# Patient Record
Sex: Female | Born: 1970
Health system: Southern US, Community
[De-identification: ages and names within clinical notes are randomized; demographics above are authoritative.]

## PROBLEM LIST (undated history)

## (undated) DIAGNOSIS — K219 Gastro-esophageal reflux disease without esophagitis: Secondary | ICD-10-CM

## (undated) DIAGNOSIS — I10 Essential (primary) hypertension: Secondary | ICD-10-CM

## (undated) HISTORY — PX: TUBAL LIGATION: SHX77

## (undated) HISTORY — DX: Essential (primary) hypertension: I10

## (undated) HISTORY — PX: WISDOM TOOTH EXTRACTION: SHX21

## (undated) HISTORY — PX: BREAST LUMPECTOMY: SHX2

---

## 1998-03-18 ENCOUNTER — Ambulatory Visit (HOSPITAL_COMMUNITY): Admission: RE | Admit: 1998-03-18 | Discharge: 1998-03-18 | Payer: Self-pay | Admitting: Obstetrics and Gynecology

## 1998-04-10 ENCOUNTER — Observation Stay (HOSPITAL_COMMUNITY): Admission: AD | Admit: 1998-04-10 | Discharge: 1998-04-11 | Payer: Self-pay | Admitting: Obstetrics and Gynecology

## 1998-04-12 ENCOUNTER — Inpatient Hospital Stay (HOSPITAL_COMMUNITY): Admission: AD | Admit: 1998-04-12 | Discharge: 1998-04-15 | Payer: Self-pay | Admitting: Obstetrics and Gynecology

## 1998-06-04 ENCOUNTER — Ambulatory Visit (HOSPITAL_COMMUNITY): Admission: RE | Admit: 1998-06-04 | Discharge: 1998-06-04 | Payer: Self-pay | Admitting: Obstetrics and Gynecology

## 2003-01-30 ENCOUNTER — Other Ambulatory Visit: Admission: RE | Admit: 2003-01-30 | Discharge: 2003-01-30 | Payer: Self-pay | Admitting: Obstetrics and Gynecology

## 2004-12-12 ENCOUNTER — Other Ambulatory Visit: Admission: RE | Admit: 2004-12-12 | Discharge: 2004-12-12 | Payer: Self-pay | Admitting: Obstetrics and Gynecology

## 2004-12-26 ENCOUNTER — Encounter: Admission: RE | Admit: 2004-12-26 | Discharge: 2004-12-26 | Payer: Self-pay | Admitting: Obstetrics and Gynecology

## 2005-01-06 ENCOUNTER — Encounter: Admission: RE | Admit: 2005-01-06 | Discharge: 2005-01-06 | Payer: Self-pay | Admitting: Obstetrics and Gynecology

## 2005-06-11 ENCOUNTER — Encounter: Admission: RE | Admit: 2005-06-11 | Discharge: 2005-06-11 | Payer: Self-pay | Admitting: Obstetrics and Gynecology

## 2005-12-09 ENCOUNTER — Encounter: Admission: RE | Admit: 2005-12-09 | Discharge: 2005-12-09 | Payer: Self-pay | Admitting: Obstetrics and Gynecology

## 2010-01-14 ENCOUNTER — Other Ambulatory Visit: Admission: RE | Admit: 2010-01-14 | Discharge: 2010-01-14 | Payer: Self-pay | Admitting: Obstetrics and Gynecology

## 2010-01-23 ENCOUNTER — Encounter: Admission: RE | Admit: 2010-01-23 | Discharge: 2010-01-23 | Payer: Self-pay | Admitting: Obstetrics and Gynecology

## 2010-04-26 ENCOUNTER — Encounter: Payer: Self-pay | Admitting: Obstetrics and Gynecology

## 2010-12-29 ENCOUNTER — Other Ambulatory Visit: Payer: Self-pay | Admitting: Obstetrics and Gynecology

## 2010-12-29 DIAGNOSIS — Z1231 Encounter for screening mammogram for malignant neoplasm of breast: Secondary | ICD-10-CM

## 2011-01-19 ENCOUNTER — Other Ambulatory Visit: Payer: Self-pay | Admitting: Obstetrics and Gynecology

## 2011-01-19 ENCOUNTER — Other Ambulatory Visit (HOSPITAL_COMMUNITY)
Admission: RE | Admit: 2011-01-19 | Discharge: 2011-01-19 | Disposition: A | Discharge status: Home / Self Care | Payer: Managed Care, Other (non HMO) | Source: Ambulatory Visit | Attending: Obstetrics and Gynecology | Admitting: Obstetrics and Gynecology

## 2011-01-19 DIAGNOSIS — Z01419 Encounter for gynecological examination (general) (routine) without abnormal findings: Secondary | ICD-10-CM | POA: Insufficient documentation

## 2011-01-27 ENCOUNTER — Ambulatory Visit
Admission: RE | Admit: 2011-01-27 | Discharge: 2011-01-27 | Disposition: A | Discharge status: Home / Self Care | Payer: Managed Care, Other (non HMO) | Source: Ambulatory Visit | Attending: Obstetrics and Gynecology | Admitting: Obstetrics and Gynecology

## 2011-01-27 DIAGNOSIS — Z1231 Encounter for screening mammogram for malignant neoplasm of breast: Secondary | ICD-10-CM

## 2011-12-22 ENCOUNTER — Other Ambulatory Visit: Payer: Self-pay | Admitting: *Deleted

## 2011-12-22 DIAGNOSIS — Z1231 Encounter for screening mammogram for malignant neoplasm of breast: Secondary | ICD-10-CM

## 2012-01-20 ENCOUNTER — Other Ambulatory Visit (HOSPITAL_COMMUNITY)
Admission: RE | Admit: 2012-01-20 | Discharge: 2012-01-20 | Disposition: A | Discharge status: Home / Self Care | Payer: Managed Care, Other (non HMO) | Source: Ambulatory Visit | Attending: Obstetrics and Gynecology | Admitting: Obstetrics and Gynecology

## 2012-01-20 ENCOUNTER — Other Ambulatory Visit: Payer: Self-pay | Admitting: Obstetrics and Gynecology

## 2012-01-20 DIAGNOSIS — Z1151 Encounter for screening for human papillomavirus (HPV): Secondary | ICD-10-CM | POA: Insufficient documentation

## 2012-01-20 DIAGNOSIS — Z01419 Encounter for gynecological examination (general) (routine) without abnormal findings: Secondary | ICD-10-CM | POA: Insufficient documentation

## 2012-01-28 ENCOUNTER — Other Ambulatory Visit: Payer: Self-pay | Admitting: Obstetrics and Gynecology

## 2012-01-28 ENCOUNTER — Ambulatory Visit
Admission: RE | Admit: 2012-01-28 | Discharge: 2012-01-28 | Disposition: A | Discharge status: Home / Self Care | Payer: Managed Care, Other (non HMO) | Source: Ambulatory Visit | Attending: *Deleted | Admitting: *Deleted

## 2012-01-28 DIAGNOSIS — Z1231 Encounter for screening mammogram for malignant neoplasm of breast: Secondary | ICD-10-CM

## 2013-01-09 ENCOUNTER — Other Ambulatory Visit: Payer: Self-pay

## 2013-01-09 DIAGNOSIS — Z1231 Encounter for screening mammogram for malignant neoplasm of breast: Secondary | ICD-10-CM

## 2013-02-02 ENCOUNTER — Ambulatory Visit
Admission: RE | Admit: 2013-02-02 | Discharge: 2013-02-02 | Disposition: A | Discharge status: Home / Self Care | Payer: Managed Care, Other (non HMO) | Source: Ambulatory Visit

## 2013-02-02 DIAGNOSIS — Z1231 Encounter for screening mammogram for malignant neoplasm of breast: Secondary | ICD-10-CM

## 2014-01-23 ENCOUNTER — Other Ambulatory Visit: Payer: Self-pay | Admitting: Obstetrics and Gynecology

## 2014-01-23 ENCOUNTER — Other Ambulatory Visit (HOSPITAL_COMMUNITY)
Admission: RE | Admit: 2014-01-23 | Discharge: 2014-01-23 | Disposition: A | Discharge status: Home / Self Care | Payer: Managed Care, Other (non HMO) | Source: Ambulatory Visit | Attending: Obstetrics and Gynecology | Admitting: Obstetrics and Gynecology

## 2014-01-23 DIAGNOSIS — Z01419 Encounter for gynecological examination (general) (routine) without abnormal findings: Secondary | ICD-10-CM | POA: Insufficient documentation

## 2014-01-25 LAB — CYTOLOGY - PAP

## 2014-02-20 ENCOUNTER — Other Ambulatory Visit: Payer: Self-pay

## 2014-02-20 DIAGNOSIS — Z1231 Encounter for screening mammogram for malignant neoplasm of breast: Secondary | ICD-10-CM

## 2014-03-19 ENCOUNTER — Ambulatory Visit
Admission: RE | Admit: 2014-03-19 | Discharge: 2014-03-19 | Disposition: A | Discharge status: Home / Self Care | Payer: Managed Care, Other (non HMO) | Source: Ambulatory Visit

## 2014-03-19 DIAGNOSIS — Z1231 Encounter for screening mammogram for malignant neoplasm of breast: Secondary | ICD-10-CM

## 2015-02-13 ENCOUNTER — Other Ambulatory Visit (HOSPITAL_COMMUNITY)
Admission: RE | Admit: 2015-02-13 | Discharge: 2015-02-13 | Disposition: A | Discharge status: Home / Self Care | Payer: Managed Care, Other (non HMO) | Source: Ambulatory Visit | Attending: Obstetrics and Gynecology | Admitting: Obstetrics and Gynecology

## 2015-02-13 ENCOUNTER — Other Ambulatory Visit: Payer: Self-pay

## 2015-02-13 ENCOUNTER — Other Ambulatory Visit: Payer: Self-pay | Admitting: Obstetrics and Gynecology

## 2015-02-13 DIAGNOSIS — Z01419 Encounter for gynecological examination (general) (routine) without abnormal findings: Secondary | ICD-10-CM | POA: Diagnosis present

## 2015-02-13 DIAGNOSIS — Z1231 Encounter for screening mammogram for malignant neoplasm of breast: Secondary | ICD-10-CM

## 2015-02-13 DIAGNOSIS — Z1151 Encounter for screening for human papillomavirus (HPV): Secondary | ICD-10-CM | POA: Diagnosis present

## 2015-02-14 LAB — CYTOLOGY - PAP

## 2015-03-21 ENCOUNTER — Ambulatory Visit
Admission: RE | Admit: 2015-03-21 | Discharge: 2015-03-21 | Disposition: A | Discharge status: Home / Self Care | Payer: Managed Care, Other (non HMO) | Source: Ambulatory Visit

## 2015-03-21 DIAGNOSIS — Z1231 Encounter for screening mammogram for malignant neoplasm of breast: Secondary | ICD-10-CM

## 2015-07-22 DIAGNOSIS — Z0000 Encounter for general adult medical examination without abnormal findings: Secondary | ICD-10-CM | POA: Diagnosis not present

## 2015-10-04 DIAGNOSIS — L700 Acne vulgaris: Secondary | ICD-10-CM | POA: Diagnosis not present

## 2016-01-30 DIAGNOSIS — R55 Syncope and collapse: Secondary | ICD-10-CM | POA: Diagnosis not present

## 2016-02-18 ENCOUNTER — Other Ambulatory Visit: Payer: Self-pay | Admitting: Obstetrics and Gynecology

## 2016-02-18 DIAGNOSIS — Z1231 Encounter for screening mammogram for malignant neoplasm of breast: Secondary | ICD-10-CM

## 2016-02-25 DIAGNOSIS — Z01411 Encounter for gynecological examination (general) (routine) with abnormal findings: Secondary | ICD-10-CM | POA: Diagnosis not present

## 2016-03-23 ENCOUNTER — Ambulatory Visit
Admission: RE | Admit: 2016-03-23 | Discharge: 2016-03-23 | Disposition: A | Discharge status: Home / Self Care | Payer: BLUE CROSS/BLUE SHIELD | Source: Ambulatory Visit | Attending: Obstetrics and Gynecology | Admitting: Obstetrics and Gynecology

## 2016-03-23 DIAGNOSIS — Z1231 Encounter for screening mammogram for malignant neoplasm of breast: Secondary | ICD-10-CM | POA: Diagnosis not present

## 2016-07-22 DIAGNOSIS — Z0000 Encounter for general adult medical examination without abnormal findings: Secondary | ICD-10-CM | POA: Diagnosis not present

## 2016-07-24 DIAGNOSIS — M65331 Trigger finger, right middle finger: Secondary | ICD-10-CM | POA: Diagnosis not present

## 2016-09-02 DIAGNOSIS — M65331 Trigger finger, right middle finger: Secondary | ICD-10-CM | POA: Diagnosis not present

## 2017-02-03 ENCOUNTER — Other Ambulatory Visit: Payer: Self-pay | Admitting: Obstetrics and Gynecology

## 2017-02-03 DIAGNOSIS — Z139 Encounter for screening, unspecified: Secondary | ICD-10-CM

## 2017-03-01 DIAGNOSIS — Z01411 Encounter for gynecological examination (general) (routine) with abnormal findings: Secondary | ICD-10-CM | POA: Diagnosis not present

## 2017-03-01 DIAGNOSIS — N920 Excessive and frequent menstruation with regular cycle: Secondary | ICD-10-CM | POA: Diagnosis not present

## 2017-03-01 DIAGNOSIS — L292 Pruritus vulvae: Secondary | ICD-10-CM | POA: Diagnosis not present

## 2017-03-24 ENCOUNTER — Ambulatory Visit
Admission: RE | Admit: 2017-03-24 | Discharge: 2017-03-24 | Disposition: A | Discharge status: Home / Self Care | Payer: BLUE CROSS/BLUE SHIELD | Source: Ambulatory Visit | Attending: Obstetrics and Gynecology | Admitting: Obstetrics and Gynecology

## 2017-03-24 DIAGNOSIS — Z1231 Encounter for screening mammogram for malignant neoplasm of breast: Secondary | ICD-10-CM | POA: Diagnosis not present

## 2017-03-24 DIAGNOSIS — Z139 Encounter for screening, unspecified: Secondary | ICD-10-CM

## 2017-06-16 DIAGNOSIS — N920 Excessive and frequent menstruation with regular cycle: Secondary | ICD-10-CM | POA: Diagnosis not present

## 2017-06-22 DIAGNOSIS — K219 Gastro-esophageal reflux disease without esophagitis: Secondary | ICD-10-CM | POA: Diagnosis not present

## 2017-06-22 DIAGNOSIS — J309 Allergic rhinitis, unspecified: Secondary | ICD-10-CM | POA: Diagnosis not present

## 2017-07-27 DIAGNOSIS — Z0000 Encounter for general adult medical examination without abnormal findings: Secondary | ICD-10-CM | POA: Diagnosis not present

## 2017-07-27 DIAGNOSIS — Z13220 Encounter for screening for lipoid disorders: Secondary | ICD-10-CM | POA: Diagnosis not present

## 2017-09-23 DIAGNOSIS — N920 Excessive and frequent menstruation with regular cycle: Secondary | ICD-10-CM | POA: Diagnosis not present

## 2017-11-18 DIAGNOSIS — L309 Dermatitis, unspecified: Secondary | ICD-10-CM | POA: Diagnosis not present

## 2017-12-07 DIAGNOSIS — L700 Acne vulgaris: Secondary | ICD-10-CM | POA: Diagnosis not present

## 2018-03-07 ENCOUNTER — Other Ambulatory Visit (HOSPITAL_COMMUNITY)
Admission: RE | Admit: 2018-03-07 | Discharge: 2018-03-07 | Disposition: A | Discharge status: Home / Self Care | Payer: BLUE CROSS/BLUE SHIELD | Source: Ambulatory Visit | Attending: Obstetrics and Gynecology | Admitting: Obstetrics and Gynecology

## 2018-03-07 ENCOUNTER — Other Ambulatory Visit: Payer: Self-pay | Admitting: Obstetrics and Gynecology

## 2018-03-07 DIAGNOSIS — N921 Excessive and frequent menstruation with irregular cycle: Secondary | ICD-10-CM | POA: Diagnosis not present

## 2018-03-07 DIAGNOSIS — Z124 Encounter for screening for malignant neoplasm of cervix: Secondary | ICD-10-CM | POA: Diagnosis not present

## 2018-03-07 DIAGNOSIS — Z01419 Encounter for gynecological examination (general) (routine) without abnormal findings: Secondary | ICD-10-CM | POA: Diagnosis not present

## 2018-03-08 LAB — CYTOLOGY - PAP
Diagnosis: NEGATIVE
HPV: NOT DETECTED

## 2018-03-10 ENCOUNTER — Other Ambulatory Visit: Payer: Self-pay | Admitting: Obstetrics and Gynecology

## 2018-03-10 DIAGNOSIS — Z1231 Encounter for screening mammogram for malignant neoplasm of breast: Secondary | ICD-10-CM

## 2018-03-25 ENCOUNTER — Ambulatory Visit: Payer: BLUE CROSS/BLUE SHIELD

## 2018-04-21 DIAGNOSIS — N921 Excessive and frequent menstruation with irregular cycle: Secondary | ICD-10-CM | POA: Diagnosis not present

## 2018-05-02 ENCOUNTER — Ambulatory Visit
Admission: RE | Admit: 2018-05-02 | Discharge: 2018-05-02 | Disposition: A | Discharge status: Home / Self Care | Payer: BLUE CROSS/BLUE SHIELD | Source: Ambulatory Visit | Attending: Obstetrics and Gynecology | Admitting: Obstetrics and Gynecology

## 2018-05-02 DIAGNOSIS — Z1231 Encounter for screening mammogram for malignant neoplasm of breast: Secondary | ICD-10-CM

## 2018-05-04 ENCOUNTER — Other Ambulatory Visit: Payer: Self-pay | Admitting: Obstetrics and Gynecology

## 2018-05-04 DIAGNOSIS — R928 Other abnormal and inconclusive findings on diagnostic imaging of breast: Secondary | ICD-10-CM

## 2018-05-06 ENCOUNTER — Ambulatory Visit
Admission: RE | Admit: 2018-05-06 | Discharge: 2018-05-06 | Disposition: A | Discharge status: Home / Self Care | Payer: BLUE CROSS/BLUE SHIELD | Source: Ambulatory Visit | Attending: Obstetrics and Gynecology | Admitting: Obstetrics and Gynecology

## 2018-05-06 ENCOUNTER — Other Ambulatory Visit: Payer: Self-pay | Admitting: Obstetrics and Gynecology

## 2018-05-06 DIAGNOSIS — R921 Mammographic calcification found on diagnostic imaging of breast: Secondary | ICD-10-CM | POA: Diagnosis not present

## 2018-05-06 DIAGNOSIS — R928 Other abnormal and inconclusive findings on diagnostic imaging of breast: Secondary | ICD-10-CM

## 2018-05-09 DIAGNOSIS — Z3202 Encounter for pregnancy test, result negative: Secondary | ICD-10-CM | POA: Diagnosis not present

## 2018-05-09 DIAGNOSIS — Z30430 Encounter for insertion of intrauterine contraceptive device: Secondary | ICD-10-CM | POA: Diagnosis not present

## 2018-05-10 ENCOUNTER — Other Ambulatory Visit: Payer: Self-pay | Admitting: Obstetrics and Gynecology

## 2018-05-10 ENCOUNTER — Ambulatory Visit
Admission: RE | Admit: 2018-05-10 | Discharge: 2018-05-10 | Disposition: A | Discharge status: Home / Self Care | Payer: BLUE CROSS/BLUE SHIELD | Source: Ambulatory Visit | Attending: Obstetrics and Gynecology | Admitting: Obstetrics and Gynecology

## 2018-05-10 DIAGNOSIS — R921 Mammographic calcification found on diagnostic imaging of breast: Secondary | ICD-10-CM

## 2018-05-10 DIAGNOSIS — R928 Other abnormal and inconclusive findings on diagnostic imaging of breast: Secondary | ICD-10-CM

## 2018-05-10 DIAGNOSIS — N6012 Diffuse cystic mastopathy of left breast: Secondary | ICD-10-CM | POA: Diagnosis not present

## 2018-05-10 HISTORY — PX: BREAST BIOPSY: SHX20

## 2018-06-13 ENCOUNTER — Other Ambulatory Visit: Payer: Self-pay | Admitting: General Surgery

## 2018-06-13 DIAGNOSIS — N6092 Unspecified benign mammary dysplasia of left breast: Secondary | ICD-10-CM

## 2018-06-14 ENCOUNTER — Other Ambulatory Visit: Payer: Self-pay | Admitting: General Surgery

## 2018-06-14 DIAGNOSIS — N6092 Unspecified benign mammary dysplasia of left breast: Secondary | ICD-10-CM

## 2018-06-20 DIAGNOSIS — Z30431 Encounter for routine checking of intrauterine contraceptive device: Secondary | ICD-10-CM | POA: Diagnosis not present

## 2018-07-13 DIAGNOSIS — M79604 Pain in right leg: Secondary | ICD-10-CM | POA: Diagnosis not present

## 2018-07-14 ENCOUNTER — Other Ambulatory Visit: Payer: Self-pay

## 2018-07-14 ENCOUNTER — Encounter: Payer: Self-pay | Admitting: Emergency Medicine

## 2018-07-14 ENCOUNTER — Emergency Department
Admission: EM | Admit: 2018-07-14 | Discharge: 2018-07-14 | Disposition: A | Discharge status: Home / Self Care | Payer: BLUE CROSS/BLUE SHIELD | Attending: Emergency Medicine | Admitting: Emergency Medicine

## 2018-07-14 ENCOUNTER — Emergency Department: Payer: BLUE CROSS/BLUE SHIELD

## 2018-07-14 DIAGNOSIS — M79661 Pain in right lower leg: Secondary | ICD-10-CM | POA: Diagnosis not present

## 2018-07-14 DIAGNOSIS — M79604 Pain in right leg: Secondary | ICD-10-CM | POA: Diagnosis not present

## 2018-07-14 DIAGNOSIS — Z79899 Other long term (current) drug therapy: Secondary | ICD-10-CM | POA: Diagnosis not present

## 2018-07-14 LAB — COMPREHENSIVE METABOLIC PANEL
ALT: 22 U/L (ref 0–44)
AST: 21 U/L (ref 15–41)
Albumin: 4 g/dL (ref 3.5–5.0)
Alkaline Phosphatase: 68 U/L (ref 38–126)
Anion gap: 10 (ref 5–15)
BUN: 9 mg/dL (ref 6–20)
CO2: 26 mmol/L (ref 22–32)
Calcium: 9.1 mg/dL (ref 8.9–10.3)
Chloride: 104 mmol/L (ref 98–111)
Creatinine, Ser: 0.8 mg/dL (ref 0.44–1.00)
GFR calc Af Amer: >60 mL/min (ref >60)
GFR calc non Af Amer: >60 mL/min (ref >60)
Glucose, Bld: 121 mg/dL — ABNORMAL HIGH (ref 70–99)
Potassium: 3.3 mmol/L — ABNORMAL LOW (ref 3.5–5.1)
Sodium: 140 mmol/L (ref 135–145)
Total Bilirubin: 1 mg/dL (ref 0.3–1.2)
Total Protein: 7.1 g/dL (ref 6.5–8.1)

## 2018-07-14 LAB — CBC
HCT: 37.2 % (ref 36.0–46.0)
Hemoglobin: 12.2 g/dL (ref 12.0–15.0)
MCH: 34.1 pg — ABNORMAL HIGH (ref 26.0–34.0)
MCHC: 32.8 g/dL (ref 30.0–36.0)
MCV: 103.9 fL — ABNORMAL HIGH (ref 80.0–100.0)
Platelets: 248 10*3/uL (ref 150–400)
RBC: 3.58 MIL/uL — ABNORMAL LOW (ref 3.87–5.11)
RDW: 11.6 % (ref 11.5–15.5)
WBC: 10 10*3/uL (ref 4.0–10.5)
nRBC: 0 % (ref 0.0–0.2)

## 2018-07-14 MED ORDER — POTASSIUM CHLORIDE CRYS ER 20 MEQ PO TBCR
40.0000 meq | EXTENDED_RELEASE_TABLET | Freq: Once | ORAL | Status: AC
  Administered 2018-07-14: 40 meq via ORAL
  Filled 2018-07-14: qty 2

## 2018-07-14 MED ORDER — OXYCODONE-ACETAMINOPHEN 5-325 MG PO TABS: 1.0000 | ORAL_TABLET | Freq: Four times a day (QID) | ORAL | 0 refills | Status: AC | PRN

## 2018-07-14 MED ORDER — HYDROCODONE-ACETAMINOPHEN 5-325 MG PO TABS
1.0000 | ORAL_TABLET | Freq: Once | ORAL | Status: AC
  Administered 2018-07-14: 1 via ORAL
  Filled 2018-07-14: qty 1

## 2018-07-14 MED ORDER — KETOROLAC TROMETHAMINE 30 MG/ML IJ SOLN
30.0000 mg | Freq: Once | INTRAMUSCULAR | Status: AC
  Administered 2018-07-14: 30 mg via INTRAVENOUS
  Filled 2018-07-14: qty 1

## 2018-07-14 NOTE — ED Notes (Signed)
Pt c /o right leg pain not relieved by Toradol, Dr. Scotty Court notified and vicodin ordered

## 2018-07-14 NOTE — ED Notes (Signed)
Late note While getting patient to the wheelchair for discharge she c/o severe pain in right leg, notified Dr. Scotty Court who went to the bedside and cleared patient for discharge

## 2018-07-14 NOTE — ED Provider Notes (Addendum)
-----------------------------------------   8:29 AM on 07/14/2018 -----------------------------------------   Assumed care from Dr. Manson Passey pending ultrasound to rule out DVT.  The ultrasound is negative.  Patient has been complaining of persistent pain so she was also given a Vicodin.  However she is still pretty uncomfortable.  I discussed the results with her and reassessed, and the leg overall appears normal on exam.  Skin is normal, no varicosities.  Good distal pulses, toes are warm.  No tenderness, Achilles function intact.  Knee is stable.  No trauma history, did not step in a hole or potentially sustain a popliteal injury.  No prior wounds or infections, no current inflammatory changes.  No crepitus.  Given the significance of the pain and the very benign history and exam, high suspicion is a ruptured Baker's cyst which will be self-limited.  No further imaging or testing necessary at this time, angiography would not be fruitful.  Plan to treat symptomatically, continue diclofenac that she has from her doctor, add on Tylenol.  I will add on 2 days of oxycodone as well.  Heat therapy.  Stable for outpatient follow-up.  Controlled substance reporting system seems to be offline right now, unable to access.  Patient reports last opioid use was codeine after a root canal 10 years ago which made her nauseated.  She seems reliable, low risk for abuse or diversion.  Final diagnoses:  Right leg pain      Sharman Cheek, MD 07/14/18 [phone]   Patient had apparent panic attack while being moved to wheelchair for discharge.  Was hyperventilating, feeling warm with bilateral finger tingling.  Encouraged her to slow her breathing if possible.  Reassess the leg which again is soft and nontender.  There is no clear explanation of her symptoms still.  When allowed to rest in the wheelchair with no staff in the room, the patient was observed from the doorway to be completely calm   Sharman Cheek,  MD 07/14/18 [phone]

## 2018-07-14 NOTE — ED Notes (Signed)
Pt stated that she took tylenol around midnight and took a diclofenac around 4am without any relief. Pt stated that she prescribed the diclofenac yesterday via PCP. Denies any injury to site.

## 2018-07-14 NOTE — ED Provider Notes (Signed)
[facility] Emergency Department Provider Note    First MD Initiated Contact with Patient 07/14/18 [phone]     (approximate)  I have reviewed the triage vital signs and the nursing notes.   HISTORY  Chief Complaint Leg Pain    HPI Amber Cooper is a 48 y.o. female with medical history as listed below   presents to the emergency department with 1-day history of nontraumatic right calf lower extremity (calf) pain which patient states is currently 10 out of 10.  Patient states the pain increased tonight which prompted her visit to the emergency department.  Patient states that she has been taking diclofenac without any relief.  Patient denies any lower extremity swelling.  Patient denies any dyspnea no chest pain.  Patient denies any personal familial history of DVT or PE.        History reviewed. No pertinent past medical history.  There are no active problems to display for this patient.   History reviewed. No pertinent surgical history.  Prior to Admission medications   Medication Sig Start Date End Date Taking? Authorizing Provider  albuterol (PROAIR HFA) 108 (90 Base) MCG/ACT inhaler Take 1-2 puffs by mouth as needed for wheezing. 07/22/16  Yes [provider]  diclofenac (VOLTAREN) 50 MG EC tablet Take 50 mg by mouth 2 (two) times daily. 07/13/18  Yes [provider]  Multiple Vitamin (MULTIVITAMIN) capsule Take 1 capsule by mouth daily.   Yes [provider]  Doxycycline Hyclate 150 MG TABS Take 1 tablet by mouth daily. 02/28/18   [provider]  omeprazole (PRILOSEC) 20 MG capsule Take 20 mg by mouth daily. 04/14/18   [provider]    Allergies Patient has no known allergies.  No family history on file.  Social History Social History   Tobacco Use  . Smoking status: Never Smoker  . Smokeless tobacco: Never Used  Substance Use Topics  . Alcohol use: Not on file  . Drug use: Not on file     Review of Systems Constitutional: No fever/chills Eyes: No visual changes. ENT: No sore throat. Cardiovascular: Denies chest pain. Respiratory: Denies shortness of breath. Gastrointestinal: No abdominal pain.  No nausea, no vomiting.  No diarrhea.  No constipation. Genitourinary: Negative for dysuria. Musculoskeletal: Negative for neck pain.  Negative for back pain.  Positive for right lower extremity pain. Integumentary: Negative for rash. Neurological: Negative for headaches, focal weakness or numbness.   ____________________________________________   PHYSICAL EXAM:  VITAL SIGNS: ED Triage Vitals  Enc Vitals Group     BP 07/14/18 0531 (!) 143/89     Pulse Rate 07/14/18 0531 86     Resp 07/14/18 0531 20     Temp 07/14/18 0531 98.6 F (37 C)     Temp Source 07/14/18 0531 Oral     SpO2 07/14/18 0531 99 %     Weight 07/14/18 0526 74.8 kg (165 lb)     Height 07/14/18 0526 1.626 m (5\' 4" )     Head Circumference --      Peak Flow --      Pain Score 07/14/18 0526 10     Pain Loc --      Pain Edu? --      Excl. in GC? --     Constitutional: Alert and oriented.  Apparent discomfort  eyes: Conjunctivae are normal.  Mouth/Throat: Mucous membranes are moist.  Oropharynx non-erythematous. Neck: No stridor.   Cardiovascular: Normal rate, regular rhythm. Good peripheral  circulation. Grossly normal heart sounds. Respiratory: Normal respiratory effort.  No retractions. Lungs CTAB. Gastrointestinal: Soft and nontender. No distention.  Musculoskeletal: No lower extremity edema appreciated.  Positive Homans sign.  Palpable bilateral PT DP pulses which are equal Neurologic:  Normal speech and language. No gross focal neurologic deficits are appreciated.  Skin:  Skin is warm, dry and intact. No rash noted. Psychiatric: Mood and affect are normal. Speech and behavior are normal.    Procedures   ____________________________________________   INITIAL IMPRESSION / MDM / ASSESSMENT  AND PLAN / ED COURSE  As part of my medical decision making, I reviewed the following data within the electronic MEDICAL RECORD NUMBER   48 year old female with above-stated history physical exam secondary to nontraumatic right leg pain.  Considered possibility of DVT and as such ultrasound performed.  Also considered possibility of arterial insufficiency however patient has equal bilateral PT DP pulses.  Patient's care transferred to Dr. Derry SkillStafford  Emilea Melvern BankerP Schey was evaluated in Emergency Department on 07/14/2018 for the symptoms described in the history of present illness. She was evaluated in the context of the global COVID-19 pandemic, which necessitated consideration that the patient might be at risk for infection with the SARS-CoV-2 virus that causes COVID-19. Institutional protocols and algorithms that pertain to the evaluation of patients at risk for COVID-19 are in a state of rapid change based on information released by regulatory bodies including the CDC and federal and state organizations. These policies and algorithms were followed during the patient's care in the ED.      ____________________________________________  FINAL CLINICAL IMPRESSION(S) / ED DIAGNOSES  Final diagnoses:  Right leg pain     MEDICATIONS GIVEN DURING THIS VISIT:  Medications  potassium chloride SA (K-DUR,KLOR-CON) CR tablet 40 mEq (has no administration in time range)  ketorolac (TORADOL) 30 MG/ML injection 30 mg (30 mg Intravenous Given 07/14/18 [id number])     ED Discharge Orders    None       Note:  This document was prepared using Dragon voice recognition software and may include unintentional dictation errors.   Darci CurrentBrown, Blades N, MD 07/14/18 [phone]

## 2018-07-14 NOTE — ED Triage Notes (Addendum)
Pt to triage via w/c, tearful, anxious & hyperventilating; c/o right lower leg pain since yesterday with no known injury; st pain increased tonight; rx diclofenac without relief; denies swelling, denies any accomp symptoms

## 2018-07-22 ENCOUNTER — Ambulatory Visit
Admission: RE | Admit: 2018-07-22 | Discharge: 2018-07-22 | Disposition: A | Discharge status: Home / Self Care | Payer: BLUE CROSS/BLUE SHIELD | Source: Ambulatory Visit | Attending: Physician Assistant | Admitting: Physician Assistant

## 2018-07-22 ENCOUNTER — Other Ambulatory Visit: Payer: Self-pay | Admitting: Physician Assistant

## 2018-07-22 ENCOUNTER — Other Ambulatory Visit: Payer: Self-pay

## 2018-07-22 DIAGNOSIS — M79671 Pain in right foot: Secondary | ICD-10-CM | POA: Diagnosis not present

## 2018-07-26 DIAGNOSIS — M545 Low back pain: Secondary | ICD-10-CM | POA: Diagnosis not present

## 2018-07-28 DIAGNOSIS — M545 Low back pain: Secondary | ICD-10-CM | POA: Diagnosis not present

## 2018-08-02 DIAGNOSIS — M5126 Other intervertebral disc displacement, lumbar region: Secondary | ICD-10-CM | POA: Diagnosis not present

## 2018-08-02 DIAGNOSIS — M545 Low back pain: Secondary | ICD-10-CM | POA: Diagnosis not present

## 2018-08-02 DIAGNOSIS — M5416 Radiculopathy, lumbar region: Secondary | ICD-10-CM | POA: Diagnosis not present

## 2018-08-03 DIAGNOSIS — M545 Low back pain: Secondary | ICD-10-CM | POA: Diagnosis not present

## 2018-08-03 DIAGNOSIS — M5416 Radiculopathy, lumbar region: Secondary | ICD-10-CM | POA: Diagnosis not present

## 2018-08-12 ENCOUNTER — Other Ambulatory Visit: Payer: Self-pay | Admitting: General Surgery

## 2018-08-17 ENCOUNTER — Encounter (HOSPITAL_BASED_OUTPATIENT_CLINIC_OR_DEPARTMENT_OTHER): Payer: Self-pay

## 2018-08-17 ENCOUNTER — Ambulatory Visit (HOSPITAL_BASED_OUTPATIENT_CLINIC_OR_DEPARTMENT_OTHER): Admit: 2018-08-17 | Payer: BLUE CROSS/BLUE SHIELD | Admitting: General Surgery

## 2018-08-17 SURGERY — RADIOACTIVE SEED GUIDED BREAST BIOPSY
Anesthesia: General | Site: Breast | Laterality: Left

## 2018-08-23 DIAGNOSIS — M545 Low back pain: Secondary | ICD-10-CM | POA: Diagnosis not present

## 2018-08-23 DIAGNOSIS — M5126 Other intervertebral disc displacement, lumbar region: Secondary | ICD-10-CM | POA: Diagnosis not present

## 2018-08-23 DIAGNOSIS — M5416 Radiculopathy, lumbar region: Secondary | ICD-10-CM | POA: Diagnosis not present

## 2018-08-30 DIAGNOSIS — M9953 Intervertebral disc stenosis of neural canal of lumbar region: Secondary | ICD-10-CM | POA: Diagnosis not present

## 2018-08-30 DIAGNOSIS — M5116 Intervertebral disc disorders with radiculopathy, lumbar region: Secondary | ICD-10-CM | POA: Diagnosis not present

## 2018-08-30 DIAGNOSIS — S330XXD Traumatic rupture of lumbar intervertebral disc, subsequent encounter: Secondary | ICD-10-CM | POA: Diagnosis not present

## 2018-09-06 DIAGNOSIS — M5116 Intervertebral disc disorders with radiculopathy, lumbar region: Secondary | ICD-10-CM | POA: Diagnosis not present

## 2018-09-06 DIAGNOSIS — S330XXD Traumatic rupture of lumbar intervertebral disc, subsequent encounter: Secondary | ICD-10-CM | POA: Diagnosis not present

## 2018-09-06 DIAGNOSIS — M9953 Intervertebral disc stenosis of neural canal of lumbar region: Secondary | ICD-10-CM | POA: Diagnosis not present

## 2018-09-13 DIAGNOSIS — M5116 Intervertebral disc disorders with radiculopathy, lumbar region: Secondary | ICD-10-CM | POA: Diagnosis not present

## 2018-09-13 DIAGNOSIS — M9953 Intervertebral disc stenosis of neural canal of lumbar region: Secondary | ICD-10-CM | POA: Diagnosis not present

## 2018-09-13 DIAGNOSIS — S330XXD Traumatic rupture of lumbar intervertebral disc, subsequent encounter: Secondary | ICD-10-CM | POA: Diagnosis not present

## 2018-09-20 DIAGNOSIS — M5116 Intervertebral disc disorders with radiculopathy, lumbar region: Secondary | ICD-10-CM | POA: Diagnosis not present

## 2018-09-20 DIAGNOSIS — M9953 Intervertebral disc stenosis of neural canal of lumbar region: Secondary | ICD-10-CM | POA: Diagnosis not present

## 2018-09-20 DIAGNOSIS — S330XXD Traumatic rupture of lumbar intervertebral disc, subsequent encounter: Secondary | ICD-10-CM | POA: Diagnosis not present

## 2018-10-04 DIAGNOSIS — M5116 Intervertebral disc disorders with radiculopathy, lumbar region: Secondary | ICD-10-CM | POA: Diagnosis not present

## 2018-11-05 HISTORY — PX: BREAST EXCISIONAL BIOPSY: SUR124

## 2018-11-07 DIAGNOSIS — Z20828 Contact with and (suspected) exposure to other viral communicable diseases: Secondary | ICD-10-CM | POA: Diagnosis not present

## 2018-11-16 ENCOUNTER — Other Ambulatory Visit: Payer: Self-pay

## 2018-11-16 ENCOUNTER — Encounter (HOSPITAL_BASED_OUTPATIENT_CLINIC_OR_DEPARTMENT_OTHER): Payer: Self-pay | Admitting: *Deleted

## 2018-11-21 ENCOUNTER — Other Ambulatory Visit: Payer: Self-pay

## 2018-11-21 ENCOUNTER — Other Ambulatory Visit (HOSPITAL_COMMUNITY): Payer: BLUE CROSS/BLUE SHIELD

## 2018-11-21 ENCOUNTER — Other Ambulatory Visit
Admission: RE | Admit: 2018-11-21 | Discharge: 2018-11-21 | Disposition: A | Discharge status: Home / Self Care | Payer: BC Managed Care – PPO | Source: Ambulatory Visit | Attending: General Surgery | Admitting: General Surgery

## 2018-11-21 DIAGNOSIS — Z20828 Contact with and (suspected) exposure to other viral communicable diseases: Secondary | ICD-10-CM | POA: Diagnosis not present

## 2018-11-21 DIAGNOSIS — Z01812 Encounter for preprocedural laboratory examination: Secondary | ICD-10-CM | POA: Diagnosis not present

## 2018-11-22 LAB — SARS CORONAVIRUS 2 (TAT 6-24 HRS): SARS Coronavirus 2: NEGATIVE

## 2018-11-23 ENCOUNTER — Other Ambulatory Visit: Payer: Self-pay

## 2018-11-23 ENCOUNTER — Ambulatory Visit
Admission: RE | Admit: 2018-11-23 | Discharge: 2018-11-23 | Disposition: A | Discharge status: Home / Self Care | Payer: BLUE CROSS/BLUE SHIELD | Source: Ambulatory Visit | Attending: General Surgery | Admitting: General Surgery

## 2018-11-23 ENCOUNTER — Other Ambulatory Visit: Payer: Self-pay | Admitting: General Surgery

## 2018-11-23 DIAGNOSIS — N6092 Unspecified benign mammary dysplasia of left breast: Secondary | ICD-10-CM

## 2018-11-23 DIAGNOSIS — R921 Mammographic calcification found on diagnostic imaging of breast: Secondary | ICD-10-CM

## 2018-11-23 NOTE — Progress Notes (Signed)

## 2018-11-24 ENCOUNTER — Ambulatory Visit (HOSPITAL_BASED_OUTPATIENT_CLINIC_OR_DEPARTMENT_OTHER)
Admission: RE | Admit: 2018-11-24 | Discharge: 2018-11-24 | Disposition: A | Discharge status: Home / Self Care | Payer: BC Managed Care – PPO | Attending: General Surgery | Admitting: General Surgery

## 2018-11-24 ENCOUNTER — Ambulatory Visit
Admission: RE | Admit: 2018-11-24 | Discharge: 2018-11-24 | Disposition: A | Discharge status: Home / Self Care | Payer: BLUE CROSS/BLUE SHIELD | Source: Ambulatory Visit | Attending: General Surgery | Admitting: General Surgery

## 2018-11-24 ENCOUNTER — Other Ambulatory Visit: Payer: Self-pay

## 2018-11-24 ENCOUNTER — Encounter (HOSPITAL_BASED_OUTPATIENT_CLINIC_OR_DEPARTMENT_OTHER)
Admission: RE | Disposition: A | Discharge status: Home / Self Care | Payer: Self-pay | Source: Home / Self Care | Attending: General Surgery

## 2018-11-24 ENCOUNTER — Encounter (HOSPITAL_BASED_OUTPATIENT_CLINIC_OR_DEPARTMENT_OTHER): Payer: Self-pay | Admitting: Anesthesiology

## 2018-11-24 ENCOUNTER — Ambulatory Visit (HOSPITAL_BASED_OUTPATIENT_CLINIC_OR_DEPARTMENT_OTHER): Payer: BC Managed Care – PPO | Admitting: Anesthesiology

## 2018-11-24 DIAGNOSIS — N6012 Diffuse cystic mastopathy of left breast: Secondary | ICD-10-CM | POA: Diagnosis not present

## 2018-11-24 DIAGNOSIS — R921 Mammographic calcification found on diagnostic imaging of breast: Secondary | ICD-10-CM | POA: Diagnosis not present

## 2018-11-24 DIAGNOSIS — Z803 Family history of malignant neoplasm of breast: Secondary | ICD-10-CM | POA: Diagnosis not present

## 2018-11-24 DIAGNOSIS — K219 Gastro-esophageal reflux disease without esophagitis: Secondary | ICD-10-CM | POA: Diagnosis not present

## 2018-11-24 DIAGNOSIS — Z79899 Other long term (current) drug therapy: Secondary | ICD-10-CM | POA: Diagnosis not present

## 2018-11-24 DIAGNOSIS — D4862 Neoplasm of uncertain behavior of left breast: Secondary | ICD-10-CM | POA: Diagnosis not present

## 2018-11-24 DIAGNOSIS — N6092 Unspecified benign mammary dysplasia of left breast: Secondary | ICD-10-CM

## 2018-11-24 HISTORY — DX: Gastro-esophageal reflux disease without esophagitis: K21.9

## 2018-11-24 HISTORY — PX: RADIOACTIVE SEED GUIDED EXCISIONAL BREAST BIOPSY: SHX6490

## 2018-11-24 SURGERY — RADIOACTIVE SEED GUIDED BREAST BIOPSY
Anesthesia: General | Site: Breast | Laterality: Left

## 2018-11-24 MED ORDER — ACETAMINOPHEN 500 MG PO TABS
ORAL_TABLET | ORAL | Status: AC
  Filled 2018-11-24: qty 2

## 2018-11-24 MED ORDER — MEPERIDINE HCL 25 MG/ML IJ SOLN: 6.2500 mg | INTRAMUSCULAR | Status: DC | PRN

## 2018-11-24 MED ORDER — GABAPENTIN 100 MG PO CAPS
100.0000 mg | ORAL_CAPSULE | ORAL | Status: AC
  Administered 2018-11-24: 07:00:00 100 mg via ORAL

## 2018-11-24 MED ORDER — FENTANYL CITRATE (PF) 100 MCG/2ML IJ SOLN
50.0000 ug | INTRAMUSCULAR | Status: AC | PRN
  Administered 2018-11-24 (×3): 50 ug via INTRAVENOUS

## 2018-11-24 MED ORDER — MIDAZOLAM HCL 2 MG/2ML IJ SOLN
INTRAMUSCULAR | Status: AC
  Filled 2018-11-24: qty 2

## 2018-11-24 MED ORDER — FENTANYL CITRATE (PF) 100 MCG/2ML IJ SOLN: 25.0000 ug | INTRAMUSCULAR | Status: DC | PRN

## 2018-11-24 MED ORDER — OXYCODONE HCL 5 MG PO TABS
5.0000 mg | ORAL_TABLET | Freq: Once | ORAL | Status: AC | PRN
  Administered 2018-11-24: 10:00:00 5 mg via ORAL

## 2018-11-24 MED ORDER — CEFAZOLIN SODIUM-DEXTROSE 2-4 GM/100ML-% IV SOLN
INTRAVENOUS | Status: AC
  Filled 2018-11-24: qty 100

## 2018-11-24 MED ORDER — KETOROLAC TROMETHAMINE 15 MG/ML IJ SOLN
INTRAMUSCULAR | Status: AC
  Filled 2018-11-24: qty 1

## 2018-11-24 MED ORDER — ENSURE PRE-SURGERY PO LIQD: 296.0000 mL | Freq: Once | ORAL | Status: DC

## 2018-11-24 MED ORDER — ONDANSETRON HCL 4 MG/2ML IJ SOLN
INTRAMUSCULAR | Status: DC | PRN
  Administered 2018-11-24: 4 mg via INTRAVENOUS

## 2018-11-24 MED ORDER — CHLORHEXIDINE GLUCONATE CLOTH 2 % EX PADS: 6.0000 | MEDICATED_PAD | Freq: Once | CUTANEOUS | Status: DC

## 2018-11-24 MED ORDER — BUPIVACAINE HCL (PF) 0.25 % IJ SOLN
INTRAMUSCULAR | Status: AC
  Filled 2018-11-24: qty 60

## 2018-11-24 MED ORDER — ACETAMINOPHEN 500 MG PO TABS
1000.0000 mg | ORAL_TABLET | ORAL | Status: AC
  Administered 2018-11-24: 07:00:00 1000 mg via ORAL

## 2018-11-24 MED ORDER — GABAPENTIN 100 MG PO CAPS
ORAL_CAPSULE | ORAL | Status: AC
  Filled 2018-11-24: qty 1

## 2018-11-24 MED ORDER — LIDOCAINE 2% (20 MG/ML) 5 ML SYRINGE
INTRAMUSCULAR | Status: AC
  Filled 2018-11-24: qty 5

## 2018-11-24 MED ORDER — FENTANYL CITRATE (PF) 100 MCG/2ML IJ SOLN
INTRAMUSCULAR | Status: AC
  Filled 2018-11-24: qty 2

## 2018-11-24 MED ORDER — MIDAZOLAM HCL 2 MG/2ML IJ SOLN
1.0000 mg | INTRAMUSCULAR | Status: DC | PRN
  Administered 2018-11-24: 08:00:00 2 mg via INTRAVENOUS

## 2018-11-24 MED ORDER — CEFAZOLIN SODIUM-DEXTROSE 2-4 GM/100ML-% IV SOLN
2.0000 g | INTRAVENOUS | Status: AC
  Administered 2018-11-24: 08:00:00 2 g via INTRAVENOUS

## 2018-11-24 MED ORDER — DEXAMETHASONE SODIUM PHOSPHATE 4 MG/ML IJ SOLN
INTRAMUSCULAR | Status: DC | PRN
  Administered 2018-11-24: 10 mg via INTRAVENOUS

## 2018-11-24 MED ORDER — LACTATED RINGERS IV SOLN
INTRAVENOUS | Status: DC
  Administered 2018-11-24: 07:00:00 via INTRAVENOUS

## 2018-11-24 MED ORDER — LACTATED RINGERS IV SOLN: INTRAVENOUS | Status: DC

## 2018-11-24 MED ORDER — DEXAMETHASONE SODIUM PHOSPHATE 10 MG/ML IJ SOLN
INTRAMUSCULAR | Status: AC
  Filled 2018-11-24: qty 1

## 2018-11-24 MED ORDER — KETOROLAC TROMETHAMINE 15 MG/ML IJ SOLN
15.0000 mg | INTRAMUSCULAR | Status: AC
  Administered 2018-11-24: 07:00:00 15 mg via INTRAVENOUS

## 2018-11-24 MED ORDER — ONDANSETRON HCL 4 MG/2ML IJ SOLN
INTRAMUSCULAR | Status: AC
  Filled 2018-11-24: qty 2

## 2018-11-24 MED ORDER — OXYCODONE HCL 5 MG PO TABS
ORAL_TABLET | ORAL | Status: AC
  Filled 2018-11-24: qty 1

## 2018-11-24 MED ORDER — METOCLOPRAMIDE HCL 5 MG/ML IJ SOLN: 10.0000 mg | Freq: Once | INTRAMUSCULAR | Status: DC | PRN

## 2018-11-24 MED ORDER — PROPOFOL 10 MG/ML IV BOLUS
INTRAVENOUS | Status: DC | PRN
  Administered 2018-11-24: 150 mg via INTRAVENOUS

## 2018-11-24 MED ORDER — PROPOFOL 10 MG/ML IV BOLUS
INTRAVENOUS | Status: AC
  Filled 2018-11-24: qty 20

## 2018-11-24 MED ORDER — BUPIVACAINE HCL (PF) 0.25 % IJ SOLN
INTRAMUSCULAR | Status: DC | PRN
  Administered 2018-11-24: 10 mL

## 2018-11-24 MED ORDER — LIDOCAINE HCL (CARDIAC) PF 100 MG/5ML IV SOSY
PREFILLED_SYRINGE | INTRAVENOUS | Status: DC | PRN
  Administered 2018-11-24: 60 mg via INTRAVENOUS

## 2018-11-24 SURGICAL SUPPLY — 65 items
ADH SKN CLS APL DERMABOND .7 (GAUZE/BANDAGES/DRESSINGS) ×1
APL PRP STRL LF DISP 70% ISPRP (MISCELLANEOUS) ×1
APPLIER CLIP 9.375 MED OPEN (MISCELLANEOUS)
APR CLP MED 9.3 20 MLT OPN (MISCELLANEOUS)
BINDER BREAST LRG (GAUZE/BANDAGES/DRESSINGS) IMPLANT
BINDER BREAST MEDIUM (GAUZE/BANDAGES/DRESSINGS) IMPLANT
BINDER BREAST XLRG (GAUZE/BANDAGES/DRESSINGS) ×1 IMPLANT
BINDER BREAST XXLRG (GAUZE/BANDAGES/DRESSINGS) IMPLANT
BLADE SURG 15 STRL LF DISP TIS (BLADE) ×1 IMPLANT
BLADE SURG 15 STRL SS (BLADE) ×2
CANISTER SUC SOCK COL 7IN (MISCELLANEOUS) IMPLANT
CANISTER SUCT 1200ML W/VALVE (MISCELLANEOUS) IMPLANT
CHLORAPREP W/TINT 26 (MISCELLANEOUS) ×2 IMPLANT
CLIP APPLIE 9.375 MED OPEN (MISCELLANEOUS) IMPLANT
CLIP VESOCCLUDE SM WIDE 6/CT (CLIP) IMPLANT
COVER BACK TABLE REUSABLE LG (DRAPES) ×2 IMPLANT
COVER MAYO STAND REUSABLE (DRAPES) ×2 IMPLANT
COVER PROBE W GEL 5X96 (DRAPES) ×2 IMPLANT
COVER WAND RF STERILE (DRAPES) IMPLANT
DECANTER SPIKE VIAL GLASS SM (MISCELLANEOUS) IMPLANT
DERMABOND ADVANCED (GAUZE/BANDAGES/DRESSINGS) ×1
DERMABOND ADVANCED .7 DNX12 (GAUZE/BANDAGES/DRESSINGS) ×1 IMPLANT
DRAPE LAPAROSCOPIC ABDOMINAL (DRAPES) ×2 IMPLANT
DRAPE UTILITY XL STRL (DRAPES) ×2 IMPLANT
DRSG TEGADERM 4X4.75 (GAUZE/BANDAGES/DRESSINGS) IMPLANT
ELECT COATED BLADE 2.86 ST (ELECTRODE) ×2 IMPLANT
ELECT REM PT RETURN 9FT ADLT (ELECTROSURGICAL) ×2
ELECTRODE REM PT RTRN 9FT ADLT (ELECTROSURGICAL) ×1 IMPLANT
GAUZE SPONGE 4X4 12PLY STRL LF (GAUZE/BANDAGES/DRESSINGS) IMPLANT
GLOVE BIO SURGEON STRL SZ7 (GLOVE) ×4 IMPLANT
GLOVE BIOGEL PI IND STRL 7.5 (GLOVE) ×1 IMPLANT
GLOVE BIOGEL PI INDICATOR 7.5 (GLOVE) ×1
GLOVE EXAM NITRILE MD LF STRL (GLOVE) ×1 IMPLANT
GLOVE SURG SS PI 7.0 STRL IVOR (GLOVE) ×1 IMPLANT
GLOVE SURG SYN 7.5  E (GLOVE) ×1
GLOVE SURG SYN 7.5 E (GLOVE) ×1 IMPLANT
GLOVE SURG SYN 7.5 PF PI (GLOVE) IMPLANT
GOWN STRL REUS W/ TWL LRG LVL3 (GOWN DISPOSABLE) ×2 IMPLANT
GOWN STRL REUS W/ TWL XL LVL3 (GOWN DISPOSABLE) IMPLANT
GOWN STRL REUS W/TWL LRG LVL3 (GOWN DISPOSABLE) ×4
GOWN STRL REUS W/TWL XL LVL3 (GOWN DISPOSABLE) ×2
HEMOSTAT ARISTA ABSORB 3G PWDR (HEMOSTASIS) IMPLANT
ILLUMINATOR WAVEGUIDE N/F (MISCELLANEOUS) IMPLANT
KIT MARKER MARGIN INK (KITS) ×2 IMPLANT
LIGHT WAVEGUIDE WIDE FLAT (MISCELLANEOUS) IMPLANT
NDL HYPO 25X1 1.5 SAFETY (NEEDLE) ×1 IMPLANT
NEEDLE HYPO 25X1 1.5 SAFETY (NEEDLE) ×2 IMPLANT
NS IRRIG 1000ML POUR BTL (IV SOLUTION) IMPLANT
PACK BASIN DAY SURGERY FS (CUSTOM PROCEDURE TRAY) ×2 IMPLANT
PENCIL BUTTON HOLSTER BLD 10FT (ELECTRODE) ×2 IMPLANT
SLEEVE SCD COMPRESS KNEE MED (MISCELLANEOUS) ×2 IMPLANT
SPONGE LAP 4X18 RFD (DISPOSABLE) ×3 IMPLANT
STRIP CLOSURE SKIN 1/2X4 (GAUZE/BANDAGES/DRESSINGS) ×2 IMPLANT
SUT MNCRL AB 4-0 PS2 18 (SUTURE) IMPLANT
SUT MON AB 5-0 PS2 18 (SUTURE) ×1 IMPLANT
SUT SILK 2 0 SH (SUTURE) IMPLANT
SUT VIC AB 2-0 SH 27 (SUTURE) ×2
SUT VIC AB 2-0 SH 27XBRD (SUTURE) ×1 IMPLANT
SUT VIC AB 3-0 SH 27 (SUTURE) ×2
SUT VIC AB 3-0 SH 27X BRD (SUTURE) ×1 IMPLANT
SYR CONTROL 10ML LL (SYRINGE) ×2 IMPLANT
TOWEL GREEN STERILE FF (TOWEL DISPOSABLE) ×2 IMPLANT
TRAY FAXITRON CT DISP (TRAY / TRAY PROCEDURE) ×2 IMPLANT
TUBE CONNECTING 20X1/4 (TUBING) IMPLANT
YANKAUER SUCT BULB TIP NO VENT (SUCTIONS) IMPLANT

## 2018-11-24 NOTE — Anesthesia Preprocedure Evaluation (Signed)
Anesthesia Evaluation  Patient identified by MRN, date of birth, ID band Patient awake    Reviewed: Allergy & Precautions, NPO status , Patient's Chart, lab work & pertinent test results  Airway Mallampati: II  TM Distance: >3 FB Neck ROM: Full    Dental no notable dental hx.    Pulmonary neg pulmonary ROS,    Pulmonary exam normal breath sounds clear to auscultation       Cardiovascular negative cardio ROS Normal cardiovascular exam Rhythm:Regular Rate:Normal     Neuro/Psych negative neurological ROS  negative psych ROS   GI/Hepatic negative GI ROS, Neg liver ROS,   Endo/Other  negative endocrine ROS  Renal/GU negative Renal ROS  negative genitourinary   Musculoskeletal negative musculoskeletal ROS (+)   Abdominal   Peds negative pediatric ROS (+)  Hematology negative hematology ROS (+)   Anesthesia Other Findings   Reproductive/Obstetrics negative OB ROS                             Anesthesia Physical Anesthesia Plan  ASA: II  Anesthesia Plan: General   Post-op Pain Management:    Induction: Intravenous  PONV Risk Score and Plan: 3 and Ondansetron, Dexamethasone, Midazolam and Treatment may vary due to age or medical condition  Airway Management Planned: LMA  Additional Equipment:   Intra-op Plan:   Post-operative Plan: Extubation in OR  Informed Consent: I have reviewed the patients History and Physical, chart, labs and discussed the procedure including the risks, benefits and alternatives for the proposed anesthesia with the patient or authorized representative who has indicated his/her understanding and acceptance.     Dental advisory given  Plan Discussed with: CRNA  Anesthesia Plan Comments:         Anesthesia Quick Evaluation  

## 2018-11-24 NOTE — Discharge Instructions (Signed)
[facility] Office Phone Number [phone]  POST OP INSTRUCTIONS Take 400 mg of ibuprofen every 8 hours or 650 mg tylenol every 6 hours for next 72 hours then as needed. Use ice several times daily also. Always review your discharge instruction sheet given to you by the facility where your surgery was performed.  IF YOU HAVE DISABILITY OR FAMILY LEAVE FORMS, YOU MUST BRING THEM TO THE OFFICE FOR PROCESSING.  DO NOT GIVE THEM TO YOUR DOCTOR.  1.  Take acetaminophen (Tylenol), naprosyn (Alleve) or ibuprofen (Advil) as needed. 2. Take your usually prescribed medications unless otherwise directed 3. If you need a refill on your pain medication, please contact your pharmacy.  They will contact our office to request authorization.  Prescriptions will not be filled after 5pm or on week-ends. 4. You should eat very light the first 24 hours after surgery, such as soup, crackers, pudding, etc.  Resume your normal diet the day after surgery. 5. Most patients will experience some swelling and bruising in the breast.  Ice packs and a good support bra will help.  Wear the breast binder provided or a sports bra for 72 hours day and night.  After that wear a sports bra during the day until you return to the office. Swelling and bruising can take several days to resolve.  6. It is common to experience some constipation if taking pain medication after surgery.  Increasing fluid intake and taking a stool softener will usually help or prevent this problem from occurring.  A mild laxative (Milk of Magnesia or Miralax) should be taken according to package directions if there are no bowel movements after 48 hours. 7. Unless discharge instructions indicate otherwise, you may remove your bandages 48 hours after surgery and you may shower at that time.  You may have steri-strips (small skin tapes) in place directly over the incision.  These strips should be left on the skin for 7-10 days and will come off on  their own.  If your surgeon used skin glue on the incision, you may shower in 24 hours.  The glue will flake off over the next 2-3 weeks.  Any sutures or staples will be removed at the office during your follow-up visit. 8. ACTIVITIES:  You may resume regular daily activities (gradually increasing) beginning the next day.  Wearing a good support bra or sports bra minimizes pain and swelling.  You may have sexual intercourse when it is comfortable. a. You may drive when you no longer are taking prescription pain medication, you can comfortably wear a seatbelt, and you can safely maneuver your car and apply brakes. b. RETURN TO WORK:  ______________________________________________________________________________________ 9. You should see your doctor in the office for a follow-up appointment approximately two weeks after your surgery.  Your doctors nurse will typically make your follow-up appointment when she calls you with your pathology report.  Expect your pathology report 3-4 business days after your surgery.  You may call to check if you do not hear from us after three days. 10. OTHER INSTRUCTIONS: _______________________________________________________________________________________________ _____________________________________________________________________________________________________________________________________ _____________________________________________________________________________________________________________________________________ _____________________________________________________________________________________________________________________________________  WHEN TO CALL DR WAKEFIELD: 1. Fever over 101.0 2. Nausea and/or vomiting. 3. Extreme swelling or bruising. 4. Continued bleeding from incision. 5. Increased pain, redness, or drainage from the incision.  The clinic staff is available to answer your questions during regular business hours.  Please dont hesitate to  call and ask to speak to one of the nurses for clinical concerns.  If you have a medical  emergency, go to the nearest emergency room or call 911.  A surgeon from [facility] is always on call at the hospital.  For further questions, please visit [email or web address]   Post Anesthesia Home Care Instructions  Activity: Get plenty of rest for the remainder of the day. A responsible individual must stay with you for 24 hours following the procedure.  For the next 24 hours, DO NOT: -Drive a car -Paediatric nurse -Drink alcoholic beverages -Take any medication unless instructed by your physician -Make any legal decisions or sign important papers.  Meals: Start with liquid foods such as gelatin or soup. Progress to regular foods as tolerated. Avoid greasy, spicy, heavy foods. If nausea and/or vomiting occur, drink only clear liquids until the nausea and/or vomiting subsides. Call your physician if vomiting continues.  Special Instructions/Symptoms: Your throat may feel dry or sore from the anesthesia or the breathing tube placed in your throat during surgery. If this causes discomfort, gargle with warm salt water. The discomfort should disappear within 24 hours.  If you had a scopolamine patch placed behind your ear for the management of post- operative nausea and/or vomiting:  1. The medication in the patch is effective for 72 hours, after which it should be removed.  Wrap patch in a tissue and discard in the trash. Wash hands thoroughly with soap and water. 2. You may remove the patch earlier than 72 hours if you experience unpleasant side effects which may include dry mouth, dizziness or visual disturbances. 3. Avoid touching the patch. Wash your hands with soap and water after contact with the patch.    You had TORADOL at 7:00 am this morning and cannot take Ibuprofen/Motrin until after 3pm You had TYLENOL at 6:56 am this morning. You had OXYCODONE at 9:50 am.

## 2018-11-24 NOTE — Transfer of Care (Signed)
Immediate Anesthesia Transfer of Care Note  Patient: Amber Cooper  Procedure(s) Performed: BRACKETED RADIOACTIVE SEED GUIDED EXCISIONAL LEFT BREAST BIOPSY (Left Breast)  Patient Location: PACU  Anesthesia Type:General  Level of Consciousness: sedated  Airway & Oxygen Therapy: Patient Spontanous Breathing and Patient connected to nasal cannula oxygen  Post-op Assessment: Report given to RN and Post -op Vital signs reviewed and stable  Post vital signs: Reviewed and stable  Last Vitals:  Vitals Value Taken Time  BP 143/88 11/24/18 0833  Temp    Pulse 98 11/24/18 0835  Resp 13 11/24/18 0835  SpO2 100 % 11/24/18 0835  Vitals shown include unvalidated device data.  Last Pain:  Vitals:   11/24/18 0704  TempSrc: Oral  PainSc: 0-No pain         Complications: No apparent anesthesia complications

## 2018-11-24 NOTE — H&P (Signed)
[age] yof referred by Dr Dion BodyVarnado for left breast calcs. she has fh of breast cancer in a mgm age 48, maternal aunt at age 50 and maternal cousin at age 50. she has no prior breast history. she has no mass or dc. she had screening mm. she has c density breasts. right breast normal. left breast has calcifications. on dx views there is area in uoq of left breast measuring 2.6x`1.7x0.7 mm in size. stereo biopsies done of anterior and posterior extent both show ALH. she is referred for evaluation.   Past Surgical History  Oral Surgery   Diagnostic Studies History Colonoscopy  never Mammogram  within last year Pap Smear  1-5 years ago  Allergies No Known Drug Allergies   Allergies Reconciled   Medication History  Junel FE 1.5/30 (1.5-30MG -MCG Tablet, Oral) Active. Omeprazole (20MG  Capsule DR, Oral) Active. Medications Reconciled  Social History  Caffeine use  Carbonated beverages, Coffee, Tea. No alcohol use  No drug use  Tobacco use  Never smoker.  Family History  Heart disease in female family member before age 50  Hypertension  Father.  Pregnancy / Birth History  Age at menarche  10 years. Contraceptive History  Contraceptive implant, Oral contraceptives. Gravida  3 Maternal age  50-25 Para  2 Regular periods    Review of Systems  General Present- Night Sweats. Not Present- Appetite Loss, Chills, Fatigue, Fever, Weight Gain and Weight Loss. Skin Not Present- Change in Wart/Mole, Dryness, Hives, Jaundice, New Lesions, Non-Healing Wounds, Rash and Ulcer. HEENT Not Present- Earache, Hearing Loss, Hoarseness, Nose Bleed, Oral Ulcers, Ringing in the Ears, Seasonal Allergies, Sinus Pain, Sore Throat, Visual Disturbances, Wears glasses/contact lenses and Yellow Eyes. Respiratory Not Present- Bloody sputum, Chronic Cough, Difficulty Breathing, Snoring and Wheezing. Cardiovascular Not Present- Chest Pain, Difficulty Breathing Lying Down, Leg Cramps,  Palpitations, Rapid Heart Rate, Shortness of Breath and Swelling of Extremities. Gastrointestinal Not Present- Abdominal Pain, Bloating, Bloody Stool, Change in Bowel Habits, Chronic diarrhea, Constipation, Difficulty Swallowing, Excessive gas, Gets full quickly at meals, Hemorrhoids, Indigestion, Nausea, Rectal Pain and Vomiting. Female Genitourinary Not Present- Frequency, Nocturia, Painful Urination, Pelvic Pain and Urgency. Musculoskeletal Not Present- Back Pain, Joint Pain, Joint Stiffness, Muscle Pain, Muscle Weakness and Swelling of Extremities. Neurological Not Present- Decreased Memory, Fainting, Headaches, Numbness, Seizures, Tingling, Tremor, Trouble walking and Weakness. Psychiatric Not Present- Anxiety, Bipolar, Change in Sleep Pattern, Depression, Fearful and Frequent crying. Endocrine Not Present- Cold Intolerance, Excessive Hunger, Hair Changes, Heat Intolerance, Hot flashes and New Diabetes. Hematology Not Present- Blood Thinners, Easy Bruising, Excessive bleeding, Gland problems, HIV and Persistent Infections.   Physical Exam General Mental Status-Alert.  Head and Neck Trachea-midline. Thyroid Gland Characteristics - normal size and consistency.  Eye Sclera/Conjunctiva - Bilateral-No scleral icterus.  Chest and Lung Exam Chest and lung exam reveals -quiet, even and easy respiratory effort with no use of accessory muscles.  Breast Nipples-No Discharge. Breast Lump-No Palpable Breast Mass.  Cardiovascular Cardiovascular examination reveals -normal heart sounds, regular rate and rhythm with no murmurs.  Neurologic Neurologic evaluation reveals -alert and oriented x 3 with no impairment of recent or remote memory.  Lymphatic Head & Neck  General Head & Neck Lymphatics: Bilateral - Description - Normal. Axillary  General Axillary Region: Bilateral - Description - Normal. Note: no Stites adenopathy   Assessment & Plan ATYPICAL LOBULAR HYPERPLASIA  (ALH) OF LEFT BREAST (N60.92) Story: left breast bracketed excisional biopsy We discussed observation for alh and repeat mm in six months. she has larger  area of calcs and significant fh. we also discussed seed bracketed excision and I think this is reasonable given larger area and possible sampling error as well as her higher risk with fh and dense breasts along with atypia dx. she is very comfortable with that plan. we discussed surgery, risks and recovery. will proceed soon

## 2018-11-24 NOTE — Interval H&P Note (Signed)
History and Physical Interval Note:  11/24/2018 7:25 AM  Amber Cooper  has presented today for surgery, with the diagnosis of LEFT BREAST [place].  The various methods of treatment have been discussed with the patient and family. After consideration of risks, benefits and other options for treatment, the patient has consented to  Procedure(s): BRACKETED RADIOACTIVE SEED GUIDED EXCISIONAL LEFT BREAST BIOPSY (Left) as a surgical intervention.  The patient's history has been reviewed, patient examined, no change in status, stable for surgery.  I have reviewed the patient's chart and labs.  Questions were answered to the patient's satisfaction.     Rolm Bookbinder

## 2018-11-24 NOTE — Op Note (Signed)
Preoperative diagnosis: Left breast atypical lobular hyperplasia on core biopsy with associated distortion and calcifications Postoperative diagnosis: Same as above Procedure: Left breast bracketed seed guided excisional biopsy Surgeon: Dr. Serita Grammes Anesthesia: General Specimens: Left breast tissue containing 2 seeds and 2 clips marked with paint Drains: None Complications: None Sponge and needle count correct at completion Dispo recovery stable  Indications: This is a 48 year old female who underwent mammography back in February with a 2.6 x 1.7 cm area of calcifications.  She has a very significant family history of breast cancer.  She underwent biopsies of the anterior and posterior extent of this that showed atypical lobular hyperplasia.  We discussed all of her options and due to her family history elected to excise this area.  This is been delayed secondary to the pandemic.  She then she had both seeds placed and was scheduled for the operating room.  Procedure: After informed consent was obtained the patient first had both seeds placed.  I had these mammograms available in the operating room.  She then underwent general anesthesia without complication.  She was prepped and draped in the standard sterile surgical fashion.  A surgical timeout was then performed.  One of the seeds was fairly close to the skin so I placed an incision in the upper outer quadrant overlying this as opposed to hide in the scar.  I infiltrated Marcaine throughout this region first.  I then made a curvilinear and scar in the upper outer quadrant.  I then used the neoprobe to guide the excision of both seeds.  This was then passed off the table after being marked with paint.  The mammogram confirmed removal of both seeds and both clips.  Hemostasis was obtained tissue I closed the tissue with 2-0 Vicryl.  Skin was closed with 3-0 Vicryl 5-0 Monocryl.  Glue was placed.  She tolerated this well was extubated and  transferred to recovery stable.

## 2018-11-24 NOTE — Anesthesia Procedure Notes (Signed)
Procedure Name: LMA Insertion Date/Time: 11/24/2018 7:39 AM Performed by: Maryella Shivers, CRNA Pre-anesthesia Checklist: Patient identified, Emergency Drugs available, Suction available and Patient being monitored Patient Re-evaluated:Patient Re-evaluated prior to induction Oxygen Delivery Method: Circle system utilized Preoxygenation: Pre-oxygenation with 100% oxygen Induction Type: IV induction Ventilation: Mask ventilation without difficulty LMA: LMA inserted LMA Size: 4.0 Number of attempts: 1 Airway Equipment and Method: Bite block Placement Confirmation: positive ETCO2 Tube secured with: Tape Dental Injury: Teeth and Oropharynx as per pre-operative assessment

## 2018-11-25 ENCOUNTER — Encounter (HOSPITAL_BASED_OUTPATIENT_CLINIC_OR_DEPARTMENT_OTHER): Payer: Self-pay | Admitting: General Surgery

## 2018-11-25 NOTE — Anesthesia Postprocedure Evaluation (Signed)
Anesthesia Post Note  Patient: Gaylen P Neidert  Procedure(s) Performed: BRACKETED RADIOACTIVE SEED GUIDED EXCISIONAL LEFT BREAST BIOPSY (Left Breast)     Patient location during evaluation: PACU Anesthesia Type: General Level of consciousness: awake and alert Pain management: pain level controlled Vital Signs Assessment: post-procedure vital signs reviewed and stable Respiratory status: spontaneous breathing, nonlabored ventilation, respiratory function stable and patient connected to nasal cannula oxygen Cardiovascular status: blood pressure returned to baseline and stable Postop Assessment: no apparent nausea or vomiting Anesthetic complications: no    Last Vitals:  Vitals:   11/24/18 0930 11/24/18 0950  BP: 136/88 (!) 144/79  Pulse: 63 77  Resp: 11 16  Temp:  36.8 C  SpO2: 99% 99%    Last Pain:  Vitals:   11/25/18 0825  TempSrc:   PainSc: 2                  Montez Hageman

## 2019-02-10 DIAGNOSIS — N6092 Unspecified benign mammary dysplasia of left breast: Secondary | ICD-10-CM | POA: Insufficient documentation

## 2019-02-10 NOTE — Progress Notes (Signed)
[facility]  Telephone:(336) [phone] Fax:(336) [phone]  ID: Amber Cooper OB: [birth year]  MR#: [id number]  HYW#:[id number]  Patient Care Team: Lennie Odor, PA-C as PCP - General (Nurse Practitioner)  CHIEF COMPLAINT:  Atypical lobular hyperplasia of left breast  INTERVAL HISTORY: Patient is a 48 year old female who recently underwent resection of an abnormal breast lesion and was found to have nonmalignant atypical lobular hyperplasia.  She is referred to clinic for discussion of prophylactic tamoxifen.  She currently feels well and is asymptomatic.  She has no neurologic complaints.  She denies any recent fevers or illnesses.  She has a good appetite and denies weight loss.  She has no chest pain, shortness of breath, cough, or hemoptysis.  She denies any nausea, vomiting, constipation, or diarrhea.  She has no urinary complaints.  Patient feels at her baseline and offers no specific complaints today.   REVIEW OF SYSTEMS:   Review of Systems  Constitutional: Negative.  Negative for fever, malaise/fatigue and weight loss.  Respiratory: Negative.  Negative for cough, hemoptysis and shortness of breath.   Cardiovascular: Negative.  Negative for chest pain and leg swelling.  Gastrointestinal: Negative.  Negative for abdominal pain.  Genitourinary: Negative.  Negative for dysuria.  Musculoskeletal: Negative.  Negative for back pain.  Skin: Negative.  Negative for rash.  Neurological: Negative.  Negative for dizziness, focal weakness, weakness and headaches.  Psychiatric/Behavioral: Negative.  The patient is not nervous/anxious.     As per HPI. Otherwise, a complete review of systems is negative.  PAST MEDICAL HISTORY: Past Medical History:  Diagnosis Date  . GERD (gastroesophageal reflux disease)     PAST SURGICAL HISTORY: Past Surgical History:  Procedure Laterality Date  . RADIOACTIVE SEED GUIDED EXCISIONAL BREAST BIOPSY Left 11/24/2018   Procedure:  BRACKETED RADIOACTIVE SEED GUIDED EXCISIONAL LEFT BREAST BIOPSY;  Surgeon: Rolm Bookbinder, MD;  Location: [facility];  Service: General;  Laterality: Left;  . TUBAL LIGATION    . WISDOM TOOTH EXTRACTION      FAMILY HISTORY: Family History  Problem Relation Age of Onset  . Breast cancer Maternal Aunt   . Breast cancer Maternal Grandmother     ADVANCED DIRECTIVES (Y/N):  N  HEALTH MAINTENANCE: Social History   Tobacco Use  . Smoking status: Never Smoker  . Smokeless tobacco: Never Used  Substance Use Topics  . Alcohol use: Not on file  . Drug use: Not on file     Colonoscopy:  PAP:  Bone density:  Lipid panel:  No Known Allergies  Current Outpatient Medications  Medication Sig Dispense Refill  . albuterol (PROAIR HFA) 108 (90 Base) MCG/ACT inhaler Take 1-2 puffs by mouth as needed for wheezing.    . [employer] ACETAMINOPHEN EX ST 500 MG tablet TAKE 2 CAPSULES BY MOUTH EVERY 8 HOURS AS DIRECTED    . gabapentin (NEURONTIN) 300 MG capsule Take 300 mg by mouth 3 (three) times daily.    Marland Kitchen levonorgestrel (MIRENA) 20 MCG/24HR IUD 1 each by Intrauterine route once.    . Multiple Vitamin (MULTIVITAMIN) capsule Take 1 capsule by mouth daily.    Marland Kitchen omeprazole (PRILOSEC) 20 MG capsule Take 20 mg by mouth daily.    . tamoxifen (NOLVADEX) 20 MG tablet Take 1 tablet (20 mg total) by mouth daily. 90 tablet 3   No current facility-administered medications for this visit.     OBJECTIVE: Vitals:   02/16/19 1006  BP: (!) 156/88  Pulse: (!) 110  Resp: 18  Temp:  98.2 F (36.8 C)  SpO2: 100%     Body mass index is 30.81 kg/m.    ECOG FS:0 - Asymptomatic  General: Well-developed, well-nourished, no acute distress. Eyes: Pink conjunctiva, anicteric sclera. HEENT: Normocephalic, moist mucous membranes, clear oropharnyx. Lungs: Clear to auscultation bilaterally. Heart: Regular rate and rhythm. No rubs, murmurs, or gallops. Abdomen: Soft, nontender, nondistended. No  organomegaly noted, normoactive bowel sounds. Musculoskeletal: No edema, cyanosis, or clubbing. Neuro: Alert, answering all questions appropriately. Cranial nerves grossly intact. Skin: No rashes or petechiae noted. Psych: Normal affect. Lymphatics: No cervical, calvicular, axillary or inguinal LAD.   LAB RESULTS:  Lab Results  Component Value Date   NA 140 07/14/2018   K 3.3 (L) 07/14/2018   CL 104 07/14/2018   CO2 26 07/14/2018   GLUCOSE 121 (H) 07/14/2018   BUN 9 07/14/2018   CREATININE 0.80 07/14/2018   CALCIUM 9.1 07/14/2018   PROT 7.1 07/14/2018   ALBUMIN 4.0 07/14/2018   AST 21 07/14/2018   ALT 22 07/14/2018   ALKPHOS 68 07/14/2018   BILITOT 1.0 07/14/2018   GFRNONAA >60 07/14/2018   GFRAA >60 07/14/2018    Lab Results  Component Value Date   WBC 10.0 07/14/2018   HGB 12.2 07/14/2018   HCT 37.2 07/14/2018   MCV 103.9 (H) 07/14/2018   PLT 248 07/14/2018     STUDIES: No results found.  ASSESSMENT: Atypical lobular hyperplasia of left breast.  PLAN:    1.  Atypical lobular hyperplasia of left breast: Although a vague benign lesion, this confers an increased risk of breast cancer approximately 3-8 times over the general population.  Patient also has a significant family history with maternal aunt and maternal grandmother with breast cancer.  We discussed at length her options moving forward including prophylactic tamoxifen and observation with mammogram every 6 months.  Given patient's family history, she elected to pursue prophylactic tamoxifen daily for 5 years.  We discussed in detail the risks and benefits as well as side effects including but not limited to hot flashes, joint pain, increased risk of endometrial cancer, increased risk of DVT.  Patient was given a prescription for 20 mg of tamoxifen today which she will take daily for 5 years completing in November 2025.  Further refills and mammograms can be ordered by her primary care physician.  No further  follow-up is necessary.  Please refer patient back if there are any questions or concerns.   I spent a total of 60 minutes face-to-face with the patient of which greater than 50% of the visit was spent in counseling and coordination of care as detailed above.   Patient expressed understanding and was in agreement with this plan. She also understands that She can call clinic at any time with any questions, concerns, or complaints.    Jeralyn Ruths, MD   02/17/2019 6:09 AM

## 2019-02-15 ENCOUNTER — Other Ambulatory Visit: Payer: Self-pay

## 2019-02-16 ENCOUNTER — Encounter: Payer: Self-pay | Admitting: Oncology

## 2019-02-16 ENCOUNTER — Inpatient Hospital Stay: Payer: BC Managed Care – PPO | Attending: Oncology | Admitting: Oncology

## 2019-02-16 ENCOUNTER — Encounter (INDEPENDENT_AMBULATORY_CARE_PROVIDER_SITE_OTHER): Payer: Self-pay

## 2019-02-16 ENCOUNTER — Other Ambulatory Visit: Payer: Self-pay

## 2019-02-16 DIAGNOSIS — Z79899 Other long term (current) drug therapy: Secondary | ICD-10-CM | POA: Insufficient documentation

## 2019-02-16 DIAGNOSIS — N6092 Unspecified benign mammary dysplasia of left breast: Secondary | ICD-10-CM | POA: Insufficient documentation

## 2019-02-16 DIAGNOSIS — Z793 Long term (current) use of hormonal contraceptives: Secondary | ICD-10-CM | POA: Insufficient documentation

## 2019-02-16 DIAGNOSIS — Z803 Family history of malignant neoplasm of breast: Secondary | ICD-10-CM | POA: Insufficient documentation

## 2019-02-16 MED ORDER — TAMOXIFEN CITRATE 20 MG PO TABS: 20.0000 mg | ORAL_TABLET | Freq: Every day | ORAL | 3 refills | Status: AC

## 2019-02-16 NOTE — Progress Notes (Signed)
Patient is here for few patient appointment she is well no complaints

## 2019-03-13 DIAGNOSIS — E7800 Pure hypercholesterolemia, unspecified: Secondary | ICD-10-CM | POA: Diagnosis not present

## 2019-03-13 DIAGNOSIS — R5383 Other fatigue: Secondary | ICD-10-CM | POA: Diagnosis not present

## 2019-03-13 DIAGNOSIS — Z01419 Encounter for gynecological examination (general) (routine) without abnormal findings: Secondary | ICD-10-CM | POA: Diagnosis not present

## 2019-03-13 DIAGNOSIS — E669 Obesity, unspecified: Secondary | ICD-10-CM | POA: Diagnosis not present

## 2019-04-13 DIAGNOSIS — N6092 Unspecified benign mammary dysplasia of left breast: Secondary | ICD-10-CM | POA: Diagnosis not present

## 2019-04-13 DIAGNOSIS — Z23 Encounter for immunization: Secondary | ICD-10-CM | POA: Diagnosis not present

## 2019-04-13 DIAGNOSIS — J45909 Unspecified asthma, uncomplicated: Secondary | ICD-10-CM | POA: Diagnosis not present

## 2019-04-13 DIAGNOSIS — Z0000 Encounter for general adult medical examination without abnormal findings: Secondary | ICD-10-CM | POA: Diagnosis not present

## 2019-05-23 ENCOUNTER — Other Ambulatory Visit: Payer: Self-pay | Admitting: Physician Assistant

## 2019-05-23 DIAGNOSIS — Z1231 Encounter for screening mammogram for malignant neoplasm of breast: Secondary | ICD-10-CM

## 2019-06-27 ENCOUNTER — Ambulatory Visit
Admission: RE | Admit: 2019-06-27 | Discharge: 2019-06-27 | Disposition: A | Discharge status: Home / Self Care | Payer: BC Managed Care – PPO | Source: Ambulatory Visit | Attending: Physician Assistant | Admitting: Physician Assistant

## 2019-06-27 ENCOUNTER — Other Ambulatory Visit: Payer: Self-pay

## 2019-06-27 DIAGNOSIS — Z1231 Encounter for screening mammogram for malignant neoplasm of breast: Secondary | ICD-10-CM | POA: Diagnosis not present

## 2019-12-18 DIAGNOSIS — L700 Acne vulgaris: Secondary | ICD-10-CM | POA: Diagnosis not present

## 2019-12-18 DIAGNOSIS — L720 Epidermal cyst: Secondary | ICD-10-CM | POA: Diagnosis not present

## 2020-03-15 DIAGNOSIS — Z01419 Encounter for gynecological examination (general) (routine) without abnormal findings: Secondary | ICD-10-CM | POA: Diagnosis not present

## 2020-04-16 DIAGNOSIS — J45909 Unspecified asthma, uncomplicated: Secondary | ICD-10-CM | POA: Diagnosis not present

## 2020-04-16 DIAGNOSIS — N6092 Unspecified benign mammary dysplasia of left breast: Secondary | ICD-10-CM | POA: Diagnosis not present

## 2020-04-16 DIAGNOSIS — K219 Gastro-esophageal reflux disease without esophagitis: Secondary | ICD-10-CM | POA: Diagnosis not present

## 2020-04-16 DIAGNOSIS — Z0000 Encounter for general adult medical examination without abnormal findings: Secondary | ICD-10-CM | POA: Diagnosis not present

## 2020-04-16 DIAGNOSIS — Z131 Encounter for screening for diabetes mellitus: Secondary | ICD-10-CM | POA: Diagnosis not present

## 2020-04-16 DIAGNOSIS — Z13220 Encounter for screening for lipoid disorders: Secondary | ICD-10-CM | POA: Diagnosis not present

## 2020-05-28 ENCOUNTER — Other Ambulatory Visit: Payer: Self-pay | Admitting: Physician Assistant

## 2020-05-28 DIAGNOSIS — Z1231 Encounter for screening mammogram for malignant neoplasm of breast: Secondary | ICD-10-CM

## 2020-06-17 DIAGNOSIS — L700 Acne vulgaris: Secondary | ICD-10-CM | POA: Diagnosis not present

## 2020-07-19 ENCOUNTER — Ambulatory Visit
Admission: RE | Admit: 2020-07-19 | Discharge: 2020-07-19 | Disposition: A | Discharge status: Home / Self Care | Payer: BC Managed Care – PPO | Source: Ambulatory Visit | Attending: Physician Assistant | Admitting: Physician Assistant

## 2020-07-19 ENCOUNTER — Other Ambulatory Visit: Payer: Self-pay

## 2020-07-19 DIAGNOSIS — Z1231 Encounter for screening mammogram for malignant neoplasm of breast: Secondary | ICD-10-CM

## 2020-07-23 ENCOUNTER — Other Ambulatory Visit: Payer: Self-pay | Admitting: Physician Assistant

## 2020-07-23 DIAGNOSIS — R928 Other abnormal and inconclusive findings on diagnostic imaging of breast: Secondary | ICD-10-CM

## 2020-08-13 ENCOUNTER — Other Ambulatory Visit: Payer: Self-pay

## 2020-08-13 ENCOUNTER — Ambulatory Visit
Admission: RE | Admit: 2020-08-13 | Discharge: 2020-08-13 | Disposition: A | Discharge status: Home / Self Care | Payer: BC Managed Care – PPO | Source: Ambulatory Visit | Attending: Physician Assistant | Admitting: Physician Assistant

## 2020-08-13 ENCOUNTER — Ambulatory Visit: Payer: BC Managed Care – PPO

## 2020-08-13 DIAGNOSIS — R928 Other abnormal and inconclusive findings on diagnostic imaging of breast: Secondary | ICD-10-CM

## 2020-10-13 DIAGNOSIS — Z20822 Contact with and (suspected) exposure to covid-19: Secondary | ICD-10-CM | POA: Diagnosis not present

## 2020-11-12 DIAGNOSIS — R0602 Shortness of breath: Secondary | ICD-10-CM | POA: Diagnosis not present

## 2020-11-12 DIAGNOSIS — U099 Post covid-19 condition, unspecified: Secondary | ICD-10-CM | POA: Diagnosis not present

## 2020-11-12 DIAGNOSIS — R053 Chronic cough: Secondary | ICD-10-CM | POA: Diagnosis not present

## 2020-12-04 IMAGING — MG MM PLC BREAST LOC DEV 1ST LESION INC MAMMO GUIDE*L*
6 series · 6 of 6 positions shown · non-contrast
Comparison: Previous exam(s).

CLINICAL DATA: Pre excision localization of 2 sites of atypical
lobular hyperplasia in the upper-outer quadrant of the left breast
diagnosed on recent biopsies.

EXAM:
MAMMOGRAPHIC GUIDED RADIOACTIVE SEED LOCALIZATION OF THE LEFT BREAST
X 2

[L ML]
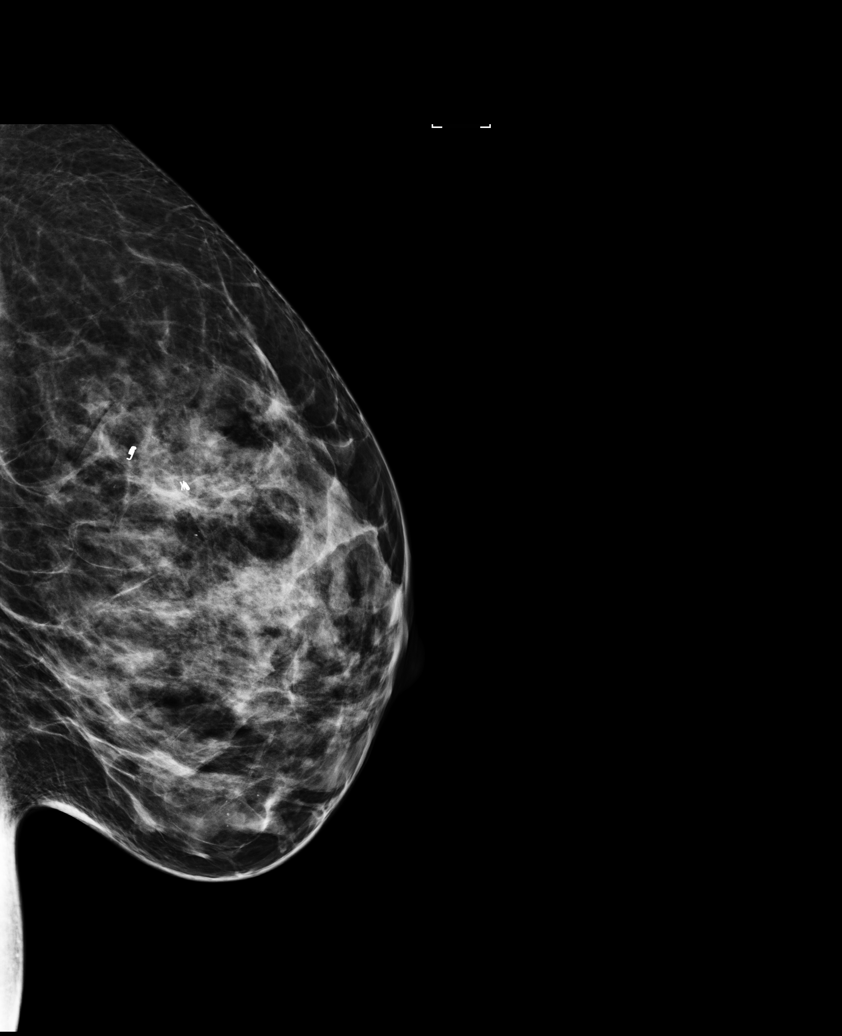

[L CC (1 of 3)]
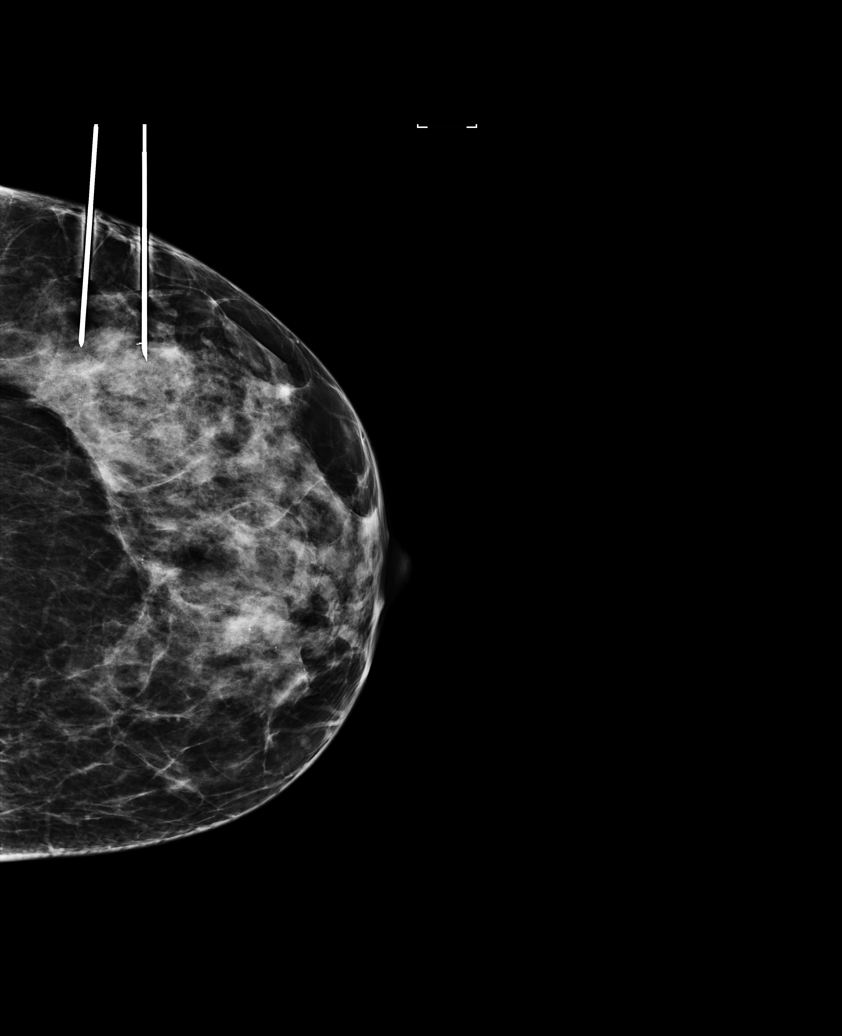

[L CC (2 of 3)]
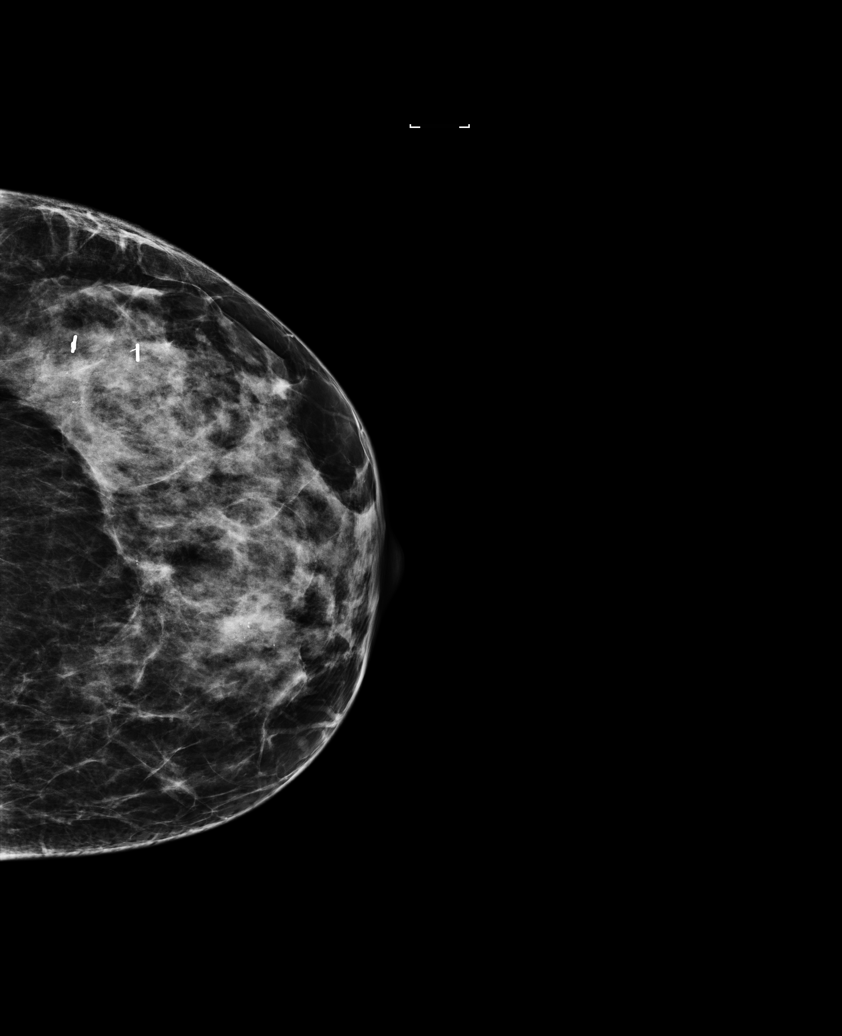

[L LM (1 of 2)]
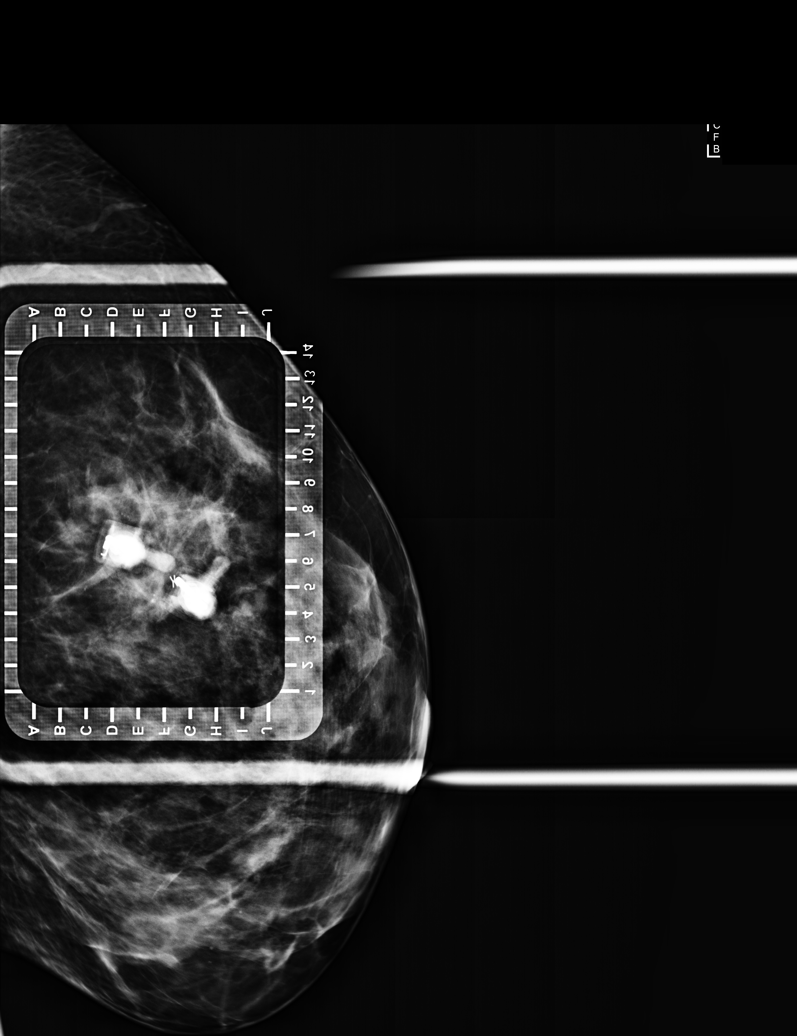

[L LM (2 of 2)]
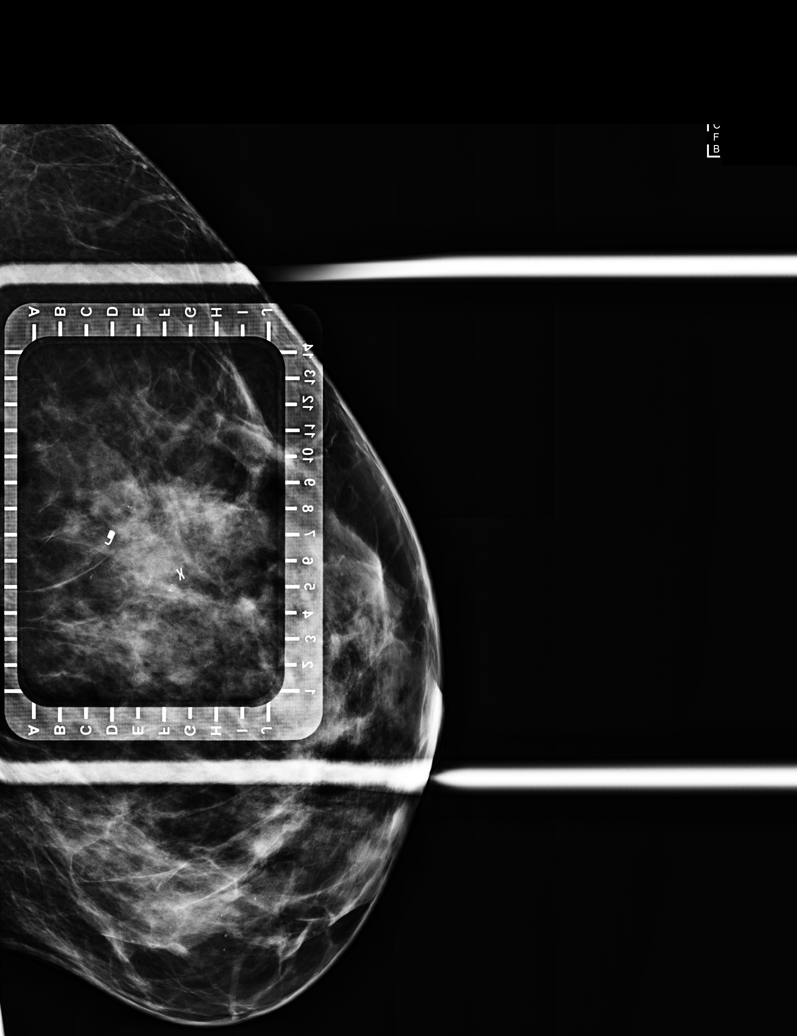

[L CC (3 of 3)]
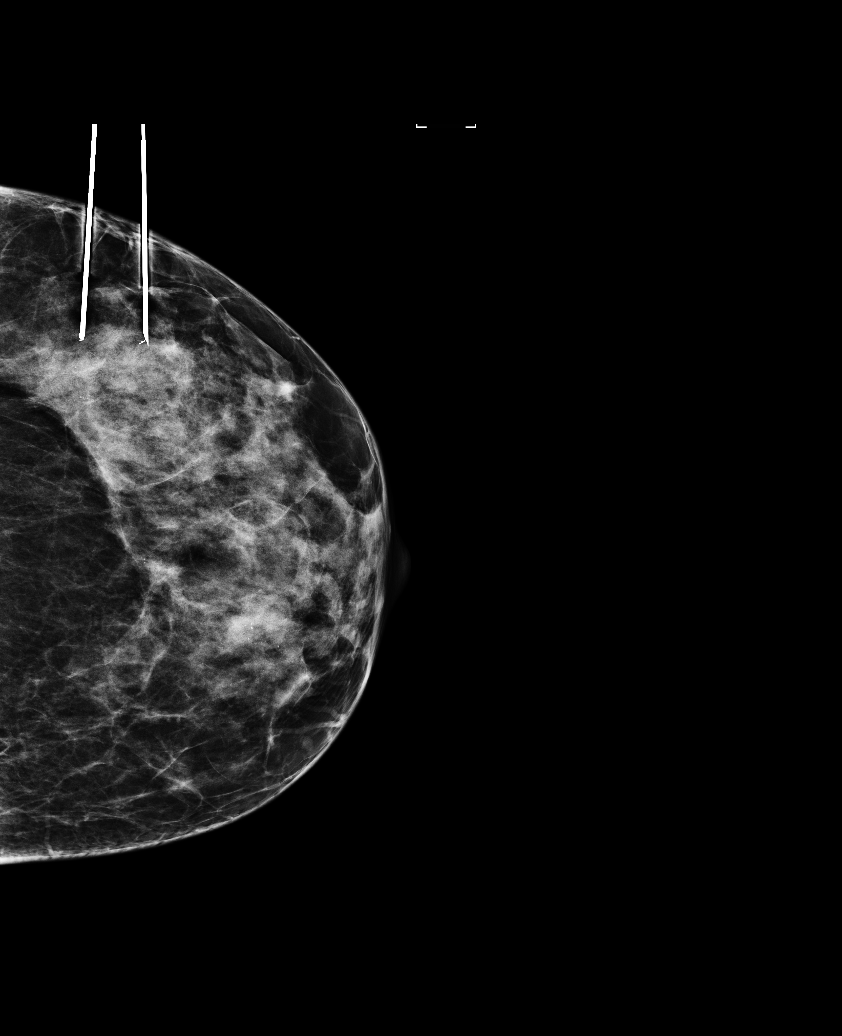

[6 of 6 positions shown; findings below may reference images not displayed]

FINDINGS: Patient presents for radioactive seed localizations prior to left
breast excision. I met with the patient and we discussed the
procedures of seed localization including benefits and alternatives.
We discussed the high likelihood of successful procedures. We
discussed the risks of the procedures including infection, bleeding,
tissue injury and further surgery. We discussed the low dose of
radioactivity involved in the procedures. Informed, written consent
was given.

The usual time-out protocol was performed immediately prior to the
procedure.

SITE #1: X SHAPED CLIP:

Using mammographic guidance, sterile technique, 1% lidocaine and an
S-OC9 radioactive seed, the recently placed X shaped biopsy marker
clip in the upper outer left was localized using a lateral approach.
The follow-up mammogram images confirm the seed in the expected
location and were marked for Dr. Jonarson.

Follow-up survey of the patient confirms presence of the radioactive
seed.

Order number of S-OC9 seed:  [id number].

Total activity:  0.251 reference Date: 11/04/2018

SITE #2: COIL SHAPED CLIP

Using mammographic guidance, sterile technique, 1% lidocaine and an
S-OC9 radioactive seed, the recently placed coil shaped biopsy
marker clip in the upper outer left was localized using a lateral
approach. The follow-up mammogram images confirm the seed in the
expected location and were marked for Dr. Jonarson.

Follow-up survey of the patient confirms presence of the radioactive
seed.

Order number of S-OC9 seed:  [id number].

Total activity:  0.251 reference Date: 11/04/2018

The patient tolerated the procedure well and was released from the
[REDACTED]. She was given instructions regarding seed removal.
IMPRESSION: Radioactive seed localization left breast x 2. No apparent
complications.

## 2020-12-05 IMAGING — DX MM BREAST SURGICAL SPECIMEN
1 series · 2 of 2 positions shown · non-contrast
Comparison: Previous exam(s).

CLINICAL DATA: Pes min radiograph status post left breast
lumpectomy.

EXAM:
SPECIMEN RADIOGRAPH OF THE LEFT BREAST

[Series 2: specimen digital x-ray, derived · left · 2 of 2 slices shown]
[im 1/2]
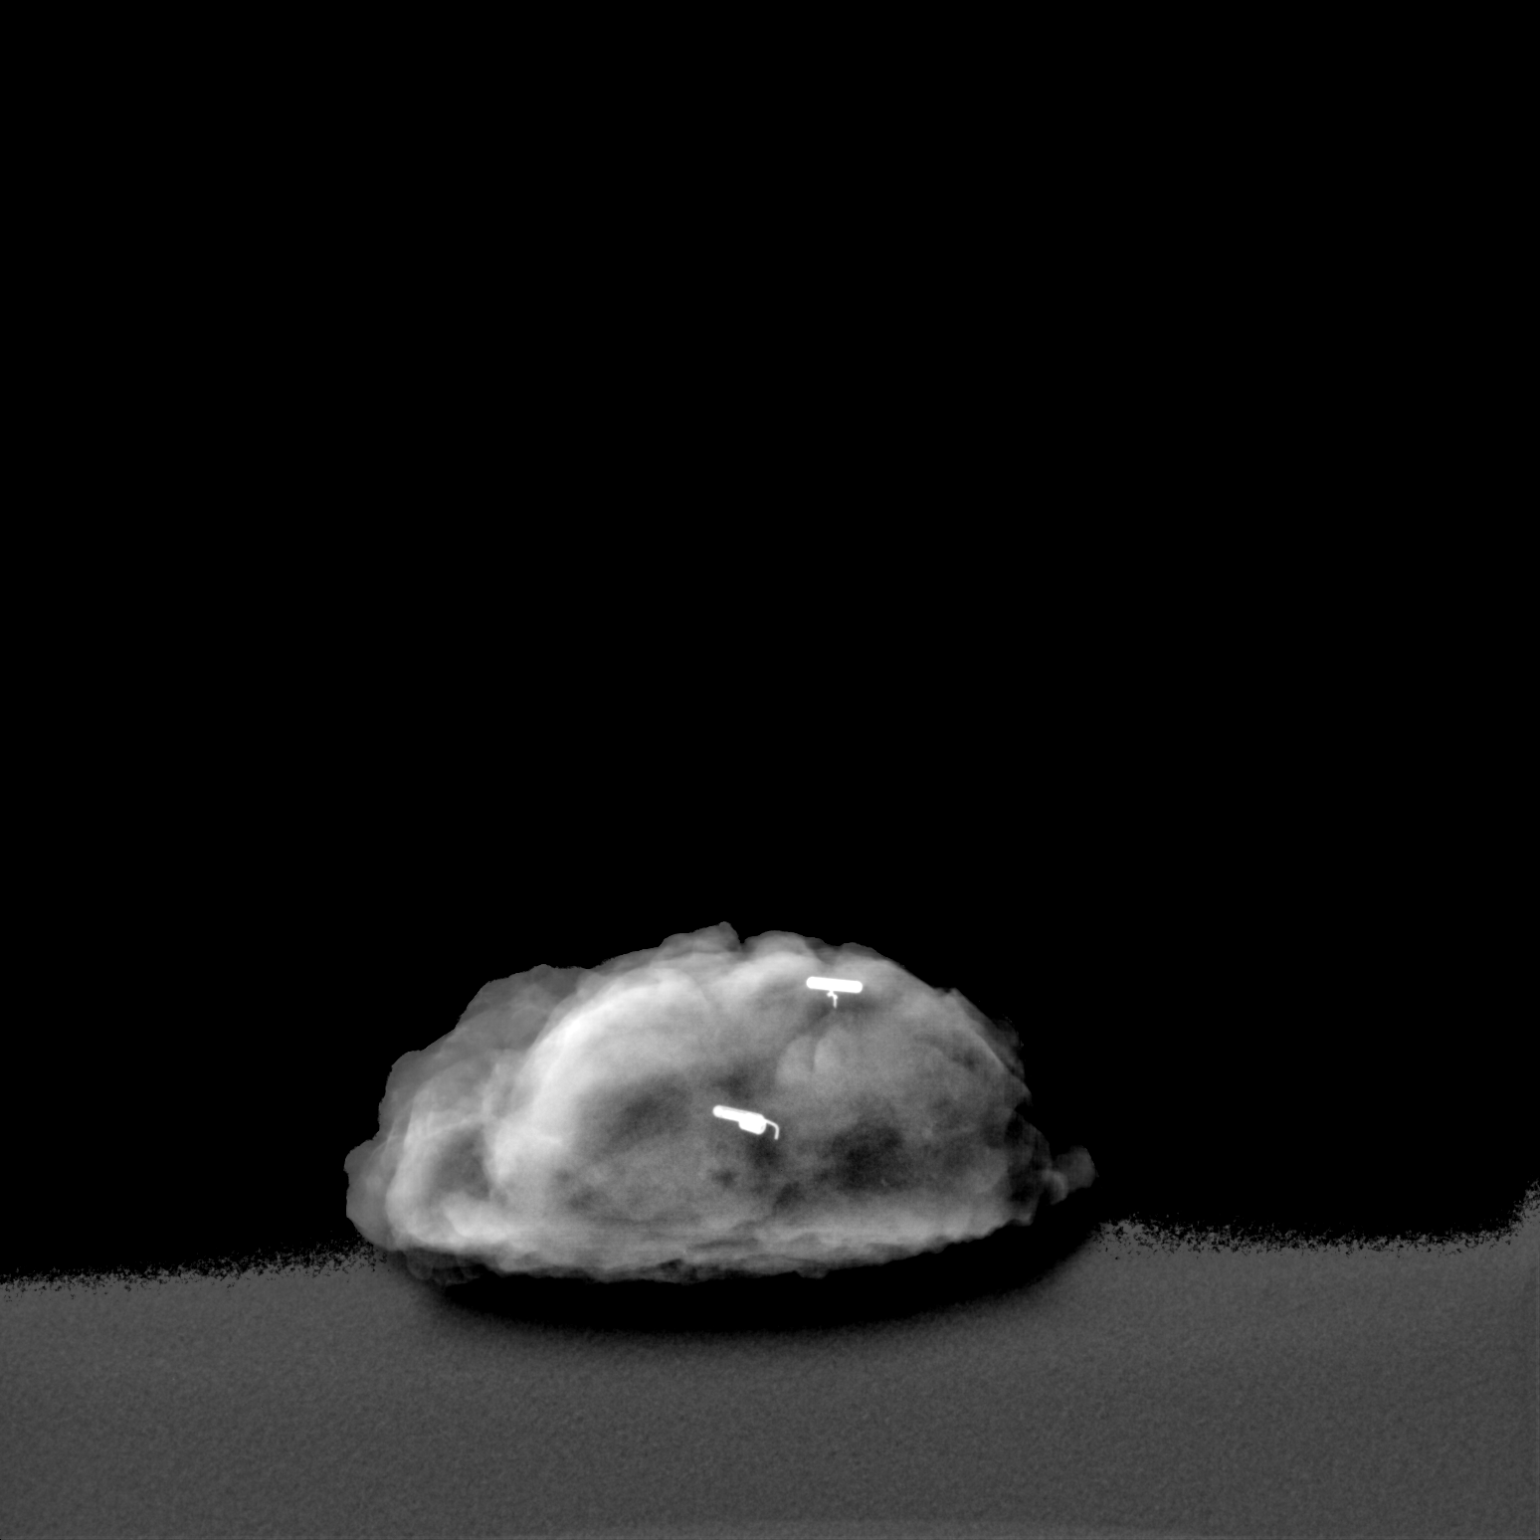
[im 2/2]
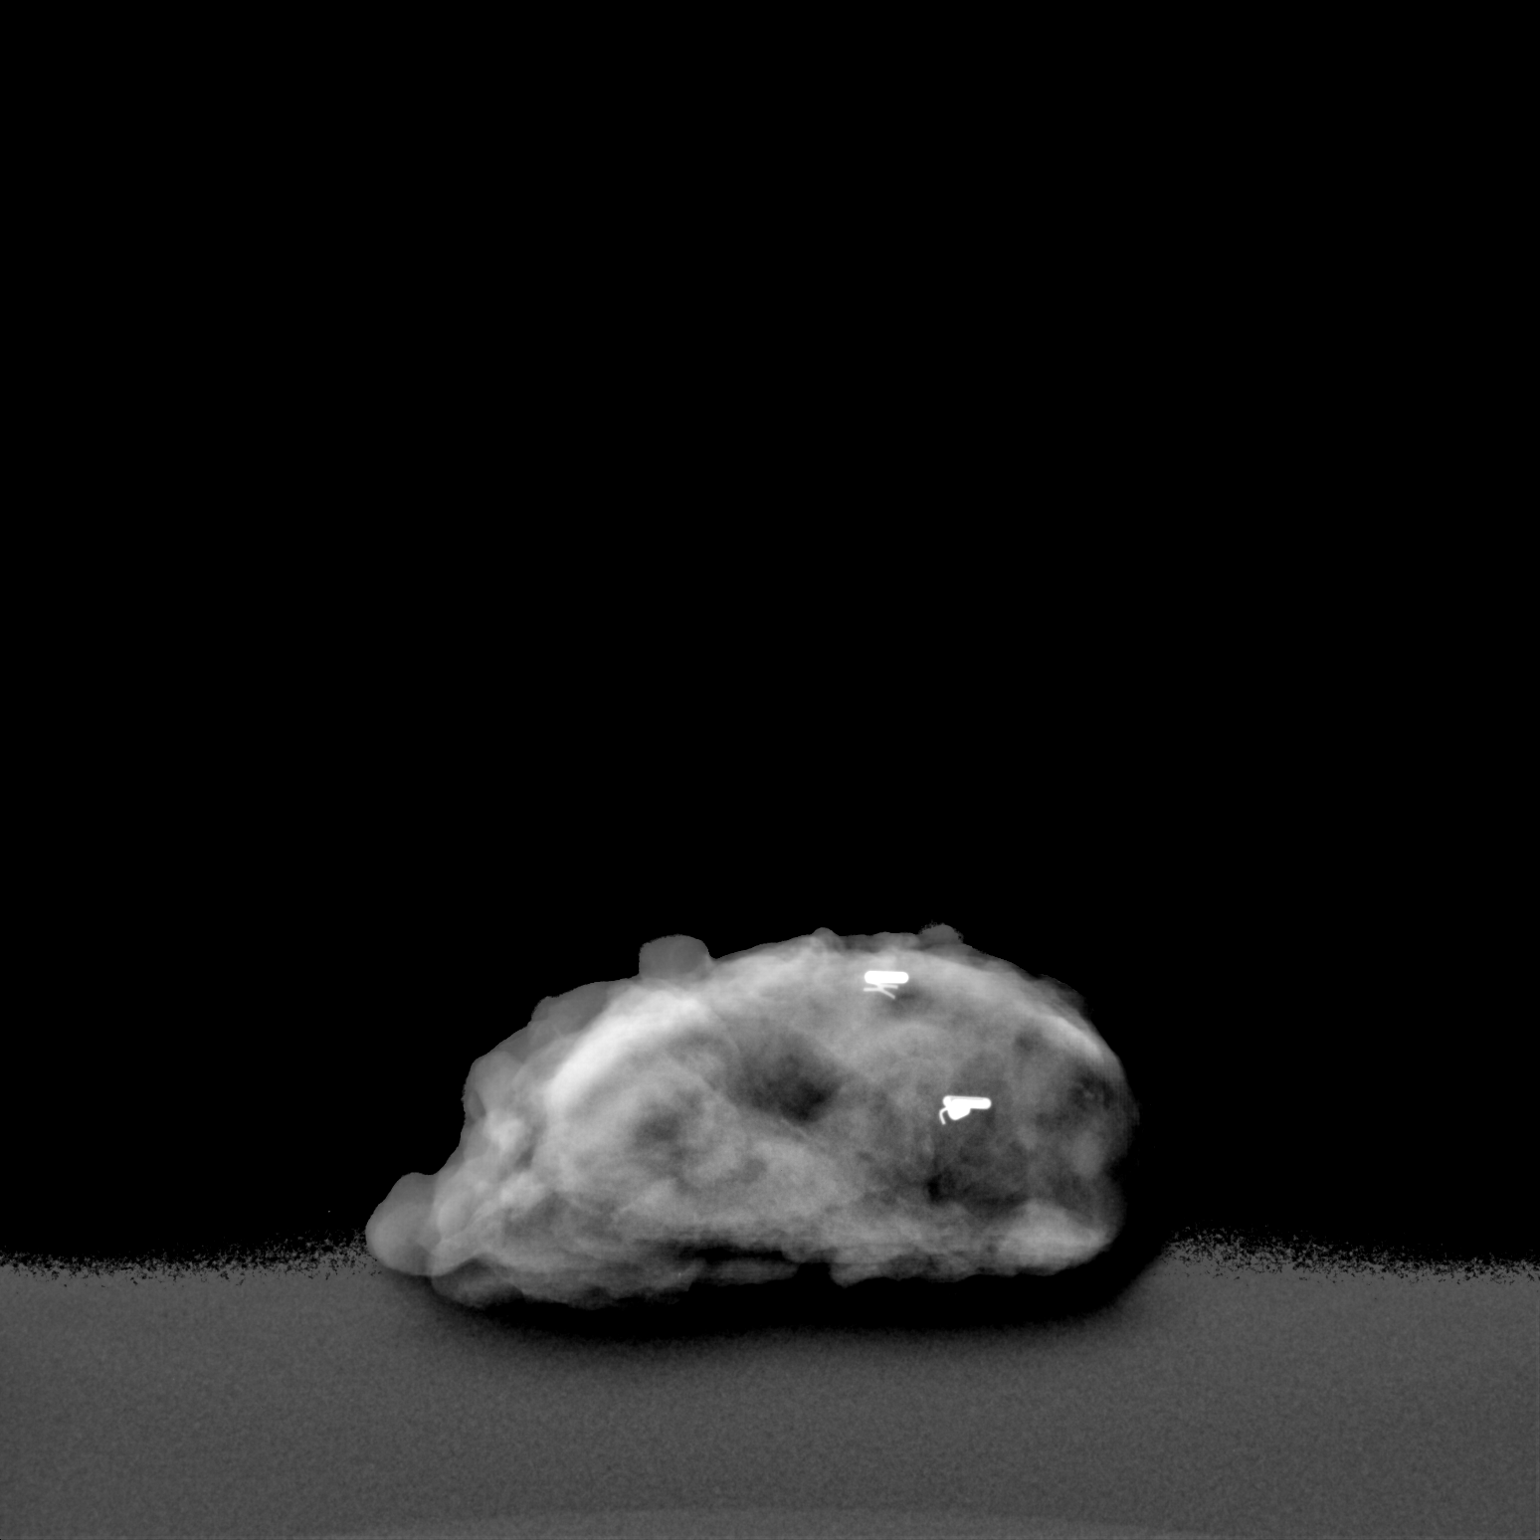

[2 of 2 positions shown; findings below may reference images not displayed]

FINDINGS: Status post excision of the left breast. The 2 radioactive seeds and
2 biopsy marker clips are present and completely intact. These
findings were communicated to the OR at [DATE] a.m.
IMPRESSION: Specimen radiograph of the left breast.

## 2021-03-21 DIAGNOSIS — Z01419 Encounter for gynecological examination (general) (routine) without abnormal findings: Secondary | ICD-10-CM | POA: Diagnosis not present

## 2021-04-18 DIAGNOSIS — Z131 Encounter for screening for diabetes mellitus: Secondary | ICD-10-CM | POA: Diagnosis not present

## 2021-04-18 DIAGNOSIS — Z0000 Encounter for general adult medical examination without abnormal findings: Secondary | ICD-10-CM | POA: Diagnosis not present

## 2021-04-18 DIAGNOSIS — Z13220 Encounter for screening for lipoid disorders: Secondary | ICD-10-CM | POA: Diagnosis not present

## 2021-04-18 DIAGNOSIS — Z23 Encounter for immunization: Secondary | ICD-10-CM | POA: Diagnosis not present

## 2021-07-18 ENCOUNTER — Other Ambulatory Visit: Payer: Self-pay | Admitting: Physician Assistant

## 2021-07-18 DIAGNOSIS — Z1231 Encounter for screening mammogram for malignant neoplasm of breast: Secondary | ICD-10-CM

## 2021-07-21 DIAGNOSIS — L700 Acne vulgaris: Secondary | ICD-10-CM | POA: Diagnosis not present

## 2021-08-08 ENCOUNTER — Ambulatory Visit
Admission: RE | Admit: 2021-08-08 | Discharge: 2021-08-08 | Disposition: A | Discharge status: Home / Self Care | Payer: BC Managed Care – PPO | Source: Ambulatory Visit | Attending: Physician Assistant | Admitting: Physician Assistant

## 2021-08-08 DIAGNOSIS — Z1231 Encounter for screening mammogram for malignant neoplasm of breast: Secondary | ICD-10-CM | POA: Diagnosis not present

## 2021-09-04 DIAGNOSIS — Z23 Encounter for immunization: Secondary | ICD-10-CM | POA: Diagnosis not present

## 2022-03-23 DIAGNOSIS — Z01419 Encounter for gynecological examination (general) (routine) without abnormal findings: Secondary | ICD-10-CM | POA: Diagnosis not present

## 2022-04-22 DIAGNOSIS — E7800 Pure hypercholesterolemia, unspecified: Secondary | ICD-10-CM | POA: Diagnosis not present

## 2022-04-22 DIAGNOSIS — Z0000 Encounter for general adult medical examination without abnormal findings: Secondary | ICD-10-CM | POA: Diagnosis not present

## 2022-07-22 DIAGNOSIS — L700 Acne vulgaris: Secondary | ICD-10-CM | POA: Diagnosis not present

## 2022-08-05 ENCOUNTER — Other Ambulatory Visit: Payer: Self-pay | Admitting: Physician Assistant

## 2022-08-05 DIAGNOSIS — Z1231 Encounter for screening mammogram for malignant neoplasm of breast: Secondary | ICD-10-CM

## 2022-09-07 ENCOUNTER — Ambulatory Visit
Admission: RE | Admit: 2022-09-07 | Discharge: 2022-09-07 | Disposition: A | Discharge status: Home / Self Care | Payer: BC Managed Care – PPO | Source: Ambulatory Visit | Attending: Physician Assistant | Admitting: Physician Assistant

## 2022-09-07 DIAGNOSIS — Z1231 Encounter for screening mammogram for malignant neoplasm of breast: Secondary | ICD-10-CM

## 2022-10-22 DIAGNOSIS — E7800 Pure hypercholesterolemia, unspecified: Secondary | ICD-10-CM | POA: Diagnosis not present

## 2023-02-04 DIAGNOSIS — L700 Acne vulgaris: Secondary | ICD-10-CM | POA: Diagnosis not present

## 2023-02-04 DIAGNOSIS — L578 Other skin changes due to chronic exposure to nonionizing radiation: Secondary | ICD-10-CM | POA: Diagnosis not present

## 2023-02-22 DIAGNOSIS — E7800 Pure hypercholesterolemia, unspecified: Secondary | ICD-10-CM | POA: Diagnosis not present

## 2023-02-22 DIAGNOSIS — Z23 Encounter for immunization: Secondary | ICD-10-CM | POA: Diagnosis not present

## 2023-05-12 DIAGNOSIS — M5116 Intervertebral disc disorders with radiculopathy, lumbar region: Secondary | ICD-10-CM | POA: Diagnosis not present

## 2023-05-18 DIAGNOSIS — M545 Low back pain, unspecified: Secondary | ICD-10-CM | POA: Diagnosis not present

## 2023-05-20 DIAGNOSIS — M545 Low back pain, unspecified: Secondary | ICD-10-CM | POA: Diagnosis not present

## 2023-06-02 DIAGNOSIS — E7800 Pure hypercholesterolemia, unspecified: Secondary | ICD-10-CM | POA: Diagnosis not present

## 2023-06-24 DIAGNOSIS — N6092 Unspecified benign mammary dysplasia of left breast: Secondary | ICD-10-CM | POA: Diagnosis not present

## 2023-06-24 DIAGNOSIS — Z0000 Encounter for general adult medical examination without abnormal findings: Secondary | ICD-10-CM | POA: Diagnosis not present

## 2023-06-24 DIAGNOSIS — J45909 Unspecified asthma, uncomplicated: Secondary | ICD-10-CM | POA: Diagnosis not present

## 2023-06-24 DIAGNOSIS — E7800 Pure hypercholesterolemia, unspecified: Secondary | ICD-10-CM | POA: Diagnosis not present

## 2023-08-12 ENCOUNTER — Other Ambulatory Visit: Payer: Self-pay | Admitting: Physician Assistant

## 2023-08-12 DIAGNOSIS — Z1231 Encounter for screening mammogram for malignant neoplasm of breast: Secondary | ICD-10-CM

## 2023-09-10 ENCOUNTER — Ambulatory Visit

## 2023-09-10 ENCOUNTER — Ambulatory Visit
Admission: RE | Admit: 2023-09-10 | Discharge: 2023-09-10 | Disposition: A | Discharge status: Home / Self Care | Source: Ambulatory Visit | Attending: Physician Assistant | Admitting: Physician Assistant

## 2023-09-10 DIAGNOSIS — Z1231 Encounter for screening mammogram for malignant neoplasm of breast: Secondary | ICD-10-CM

## 2024-02-03 DIAGNOSIS — J45909 Unspecified asthma, uncomplicated: Secondary | ICD-10-CM | POA: Diagnosis not present

## 2024-02-03 DIAGNOSIS — R030 Elevated blood-pressure reading, without diagnosis of hypertension: Secondary | ICD-10-CM | POA: Diagnosis not present

## 2024-02-03 DIAGNOSIS — Z23 Encounter for immunization: Secondary | ICD-10-CM | POA: Diagnosis not present

## 2024-02-03 DIAGNOSIS — R000 Tachycardia, unspecified: Secondary | ICD-10-CM | POA: Diagnosis not present

## 2024-02-07 DIAGNOSIS — L810 Postinflammatory hyperpigmentation: Secondary | ICD-10-CM | POA: Diagnosis not present

## 2024-02-07 DIAGNOSIS — L700 Acne vulgaris: Secondary | ICD-10-CM | POA: Diagnosis not present

## 2024-02-21 DIAGNOSIS — Z01419 Encounter for gynecological examination (general) (routine) without abnormal findings: Secondary | ICD-10-CM | POA: Diagnosis not present

## 2024-02-21 DIAGNOSIS — Z124 Encounter for screening for malignant neoplasm of cervix: Secondary | ICD-10-CM | POA: Diagnosis not present

## 2024-02-21 DIAGNOSIS — N951 Menopausal and female climacteric states: Secondary | ICD-10-CM | POA: Diagnosis not present

## 2024-02-21 DIAGNOSIS — Z1151 Encounter for screening for human papillomavirus (HPV): Secondary | ICD-10-CM | POA: Diagnosis not present

## 2024-03-08 DIAGNOSIS — J45909 Unspecified asthma, uncomplicated: Secondary | ICD-10-CM | POA: Diagnosis not present

## 2024-05-11 ENCOUNTER — Encounter: Payer: Self-pay | Admitting: Obstetrics and Gynecology

## 2024-05-11 ENCOUNTER — Ambulatory Visit: Admitting: Obstetrics and Gynecology

## 2024-05-11 VITALS — BP 130/78 | HR 95 | Ht 64.37 in | Wt 175.4 lb

## 2024-05-11 DIAGNOSIS — D3610 Benign neoplasm of peripheral nerves and autonomic nervous system, unspecified: Secondary | ICD-10-CM

## 2024-05-11 DIAGNOSIS — B977 Papillomavirus as the cause of diseases classified elsewhere: Secondary | ICD-10-CM | POA: Insufficient documentation

## 2024-05-11 NOTE — Progress Notes (Unsigned)
 Reason for Consult:High Risk  HPV +18/45 and Neurofibroma on colposcopy results Referring Physician: Rilla Barker NP with [facility]  Amber Cooper is an 54 y.o. female. Who presents for consultation. She is referred by Lauraine Rilla Barker with [facility]. On 02/21/2024 she had a pap smear performed that was negative however High Risk hpv was detected and it was positive for strain 18/45. She had a colpscopy performed by     Menstrual History: Menarche age: *** No LMP recorded. (Menstrual status: IUD).    Past Medical History:  Diagnosis Date   GERD (gastroesophageal reflux disease)    Hypertension     Past Surgical History:  Procedure Laterality Date   BREAST BIOPSY Left 05/10/2018   BREAST EXCISIONAL BIOPSY Left 11/2018   LOBULAR NEOPLASIA (ATYPICAL LOBULAR HYPERPLASIA   BREAST LUMPECTOMY Left    pre cancerous, radioactive seed   COLPOSCOPY     RADIOACTIVE SEED GUIDED EXCISIONAL BREAST BIOPSY Left 11/24/2018   Procedure: BRACKETED RADIOACTIVE SEED GUIDED EXCISIONAL LEFT BREAST BIOPSY;  Surgeon: Ebbie Cough, MD;  Location: [facility];  Service: General;  Laterality: Left;   TUBAL LIGATION     WISDOM TOOTH EXTRACTION      Family History  Problem Relation Age of Onset   Breast cancer Maternal Aunt    Ovarian cancer Maternal Grandmother    Breast cancer Maternal Grandmother    Cervical cancer Maternal Grandmother    Heart disease Paternal Grandmother     Social History:  reports that she has never smoked. She has been exposed to tobacco smoke. She has never used smokeless tobacco. She reports that she does not currently use alcohol. She reports that she does not currently use drugs.  Allergies: Allergies[1]  Medications: {medication reviewed/display:3041432}  ROS  Blood pressure 130/78, pulse 95, height 5' 4.37 (1.635 m), weight 175 lb 6.4 oz (79.6 kg), SpO2 97%. Physical Exam Vitals  reviewed. Exam conducted with a chaperone present.  Constitutional:      Appearance: Normal appearance.  HENT:     Head: Normocephalic.  Pulmonary:     Effort: Pulmonary effort is normal.  Abdominal:     General: There is no distension.     Palpations: Abdomen is soft. There is no mass.     Tenderness: There is no abdominal tenderness. There is no guarding or rebound.  Genitourinary:    General: Normal vulva.  Musculoskeletal:        General: Normal range of motion.     Cervical back: Normal range of motion.  Skin:    General: Skin is warm.  Neurological:     General: No focal deficit present.     Mental Status: She is alert and oriented to person, place, and time.  Psychiatric:        Mood and Affect: Mood normal.        Behavior: Behavior normal.     No results found for this or any previous visit (from the past 48 hours).  No results found.  Assessment/Plan: Assessment & Plan High risk HPV infection     Neurofibroma        Rexene Hoit M.D. 05/11/2024       [1] Allergies Allergen Reactions   Atorvastatin Other (See Comments)   Penicillins Dermatitis

## 2024-05-11 NOTE — Assessment & Plan Note (Signed)
 Amber Cooper

## 2024-05-12 ENCOUNTER — Encounter: Payer: Self-pay | Admitting: Obstetrics and Gynecology
